# Patient Record
Sex: Female | Born: 1950 | Race: White | Hispanic: No | Smoking: Former smoker
Health system: Southern US, Community
[De-identification: ages and names within clinical notes are randomized; demographics above are authoritative.]

## PROBLEM LIST (undated history)

## (undated) DIAGNOSIS — E785 Hyperlipidemia, unspecified: Secondary | ICD-10-CM

## (undated) DIAGNOSIS — C541 Malignant neoplasm of endometrium: Secondary | ICD-10-CM

## (undated) DIAGNOSIS — K5792 Diverticulitis of intestine, part unspecified, without perforation or abscess without bleeding: Secondary | ICD-10-CM

## (undated) HISTORY — PX: OTHER SURGICAL HISTORY: SHX169

## (undated) HISTORY — PX: HAMMER TOE SURGERY: SHX385

## (undated) HISTORY — DX: Hyperlipidemia, unspecified: E78.5

## (undated) HISTORY — DX: Malignant neoplasm of endometrium: C54.1

---

## 1989-02-07 HISTORY — PX: EXPLORATORY LAPAROTOMY: SUR591

## 1998-07-08 ENCOUNTER — Other Ambulatory Visit: Admission: RE | Admit: 1998-07-08 | Discharge: 1998-07-08 | Payer: Self-pay | Admitting: Family Medicine

## 1998-08-18 ENCOUNTER — Encounter (INDEPENDENT_AMBULATORY_CARE_PROVIDER_SITE_OTHER): Payer: Self-pay | Admitting: Specialist

## 1998-08-18 ENCOUNTER — Other Ambulatory Visit: Admission: RE | Admit: 1998-08-18 | Discharge: 1998-08-18 | Payer: Self-pay | Admitting: Gastroenterology

## 1999-04-22 ENCOUNTER — Encounter: Payer: Self-pay | Admitting: Family Medicine

## 1999-04-22 ENCOUNTER — Encounter: Admission: RE | Admit: 1999-04-22 | Discharge: 1999-04-22 | Payer: Self-pay | Admitting: Family Medicine

## 1999-10-12 ENCOUNTER — Other Ambulatory Visit: Admission: RE | Admit: 1999-10-12 | Discharge: 1999-10-12 | Payer: Self-pay | Admitting: Internal Medicine

## 2000-05-17 ENCOUNTER — Encounter: Admission: RE | Admit: 2000-05-17 | Discharge: 2000-05-17 | Payer: Self-pay | Admitting: Internal Medicine

## 2000-05-17 ENCOUNTER — Encounter: Payer: Self-pay | Admitting: Internal Medicine

## 2000-07-31 ENCOUNTER — Ambulatory Visit (HOSPITAL_COMMUNITY)
Admission: RE | Admit: 2000-07-31 | Discharge: 2000-07-31 | Payer: Self-pay | Admitting: Orthodontics and Dentofacial Orthopedics

## 2000-07-31 ENCOUNTER — Encounter: Payer: Self-pay | Admitting: Orthodontics and Dentofacial Orthopedics

## 2000-08-06 ENCOUNTER — Ambulatory Visit (HOSPITAL_COMMUNITY)
Admission: RE | Admit: 2000-08-06 | Discharge: 2000-08-06 | Payer: Self-pay | Admitting: Orthodontics and Dentofacial Orthopedics

## 2000-10-18 ENCOUNTER — Encounter: Admission: RE | Admit: 2000-10-18 | Discharge: 2000-10-18 | Payer: Self-pay | Admitting: Internal Medicine

## 2000-10-18 ENCOUNTER — Encounter: Payer: Self-pay | Admitting: Internal Medicine

## 2001-07-23 ENCOUNTER — Encounter: Payer: Self-pay | Admitting: Internal Medicine

## 2001-07-23 ENCOUNTER — Encounter: Admission: RE | Admit: 2001-07-23 | Discharge: 2001-07-23 | Payer: Self-pay | Admitting: Internal Medicine

## 2002-05-21 ENCOUNTER — Encounter: Admission: RE | Admit: 2002-05-21 | Discharge: 2002-05-21 | Payer: Self-pay | Admitting: Internal Medicine

## 2002-05-21 ENCOUNTER — Encounter: Payer: Self-pay | Admitting: Internal Medicine

## 2002-06-10 ENCOUNTER — Ambulatory Visit (HOSPITAL_COMMUNITY): Admission: RE | Admit: 2002-06-10 | Discharge: 2002-06-10 | Payer: Self-pay | Admitting: Gastroenterology

## 2002-09-23 ENCOUNTER — Encounter: Admission: RE | Admit: 2002-09-23 | Discharge: 2002-09-23 | Payer: Self-pay | Admitting: Internal Medicine

## 2002-09-23 ENCOUNTER — Encounter: Payer: Self-pay | Admitting: Internal Medicine

## 2003-09-24 ENCOUNTER — Encounter: Admission: RE | Admit: 2003-09-24 | Discharge: 2003-09-24 | Payer: Self-pay | Admitting: Internal Medicine

## 2003-10-27 ENCOUNTER — Other Ambulatory Visit: Admission: RE | Admit: 2003-10-27 | Discharge: 2003-10-27 | Payer: Self-pay | Admitting: Internal Medicine

## 2004-10-15 ENCOUNTER — Encounter: Admission: RE | Admit: 2004-10-15 | Discharge: 2004-10-15 | Payer: Self-pay | Admitting: Family Medicine

## 2004-10-27 ENCOUNTER — Other Ambulatory Visit: Admission: RE | Admit: 2004-10-27 | Discharge: 2004-10-27 | Payer: Self-pay | Admitting: Obstetrics and Gynecology

## 2005-10-17 ENCOUNTER — Encounter: Admission: RE | Admit: 2005-10-17 | Discharge: 2005-10-17 | Payer: Self-pay | Admitting: Family Medicine

## 2005-10-31 ENCOUNTER — Other Ambulatory Visit: Admission: RE | Admit: 2005-10-31 | Discharge: 2005-10-31 | Payer: Self-pay | Admitting: Family Medicine

## 2006-10-20 ENCOUNTER — Encounter: Admission: RE | Admit: 2006-10-20 | Discharge: 2006-10-20 | Payer: Self-pay | Admitting: Family Medicine

## 2007-10-22 ENCOUNTER — Encounter: Admission: RE | Admit: 2007-10-22 | Discharge: 2007-10-22 | Payer: Self-pay | Admitting: Family Medicine

## 2008-03-21 ENCOUNTER — Ambulatory Visit: Admission: RE | Admit: 2008-03-21 | Discharge: 2008-03-21 | Payer: Self-pay | Admitting: Gynecology

## 2008-04-07 HISTORY — PX: ABDOMINAL HYSTERECTOMY: SHX81

## 2008-04-08 ENCOUNTER — Inpatient Hospital Stay (HOSPITAL_COMMUNITY): Admission: RE | Admit: 2008-04-08 | Discharge: 2008-04-11 | Payer: Self-pay | Admitting: Obstetrics and Gynecology

## 2008-04-08 ENCOUNTER — Encounter: Payer: Self-pay | Admitting: Gynecologic Oncology

## 2008-04-08 ENCOUNTER — Encounter (INDEPENDENT_AMBULATORY_CARE_PROVIDER_SITE_OTHER): Payer: Self-pay | Admitting: Obstetrics and Gynecology

## 2008-05-02 ENCOUNTER — Ambulatory Visit: Admission: RE | Admit: 2008-05-02 | Discharge: 2008-05-02 | Payer: Self-pay | Admitting: Gynecology

## 2008-06-04 ENCOUNTER — Ambulatory Visit: Admission: RE | Admit: 2008-06-04 | Discharge: 2008-06-04 | Payer: Self-pay | Admitting: Gynecologic Oncology

## 2008-10-08 ENCOUNTER — Ambulatory Visit: Admission: RE | Admit: 2008-10-08 | Discharge: 2008-10-08 | Payer: Self-pay | Admitting: Gynecologic Oncology

## 2008-10-08 ENCOUNTER — Encounter: Payer: Self-pay | Admitting: Gynecologic Oncology

## 2008-10-08 ENCOUNTER — Other Ambulatory Visit: Admission: RE | Admit: 2008-10-08 | Discharge: 2008-10-08 | Payer: Self-pay | Admitting: Gynecologic Oncology

## 2008-10-22 ENCOUNTER — Encounter: Admission: RE | Admit: 2008-10-22 | Discharge: 2008-10-22 | Payer: Self-pay | Admitting: Family Medicine

## 2009-09-04 ENCOUNTER — Ambulatory Visit: Admission: RE | Admit: 2009-09-04 | Discharge: 2009-09-04 | Payer: Self-pay | Admitting: Gynecology

## 2009-09-04 ENCOUNTER — Other Ambulatory Visit: Admission: RE | Admit: 2009-09-04 | Discharge: 2009-09-04 | Payer: Self-pay | Admitting: Gynecology

## 2009-10-23 ENCOUNTER — Encounter: Admission: RE | Admit: 2009-10-23 | Discharge: 2009-10-23 | Payer: Self-pay | Admitting: Family Medicine

## 2010-03-09 ENCOUNTER — Other Ambulatory Visit: Payer: Self-pay | Admitting: Obstetrics and Gynecology

## 2010-05-20 LAB — URINALYSIS, ROUTINE W REFLEX MICROSCOPIC
Glucose, UA: NEGATIVE mg/dL
Ketones, ur: NEGATIVE mg/dL
Ketones, ur: NEGATIVE mg/dL
Leukocytes, UA: NEGATIVE
Leukocytes, UA: NEGATIVE
Protein, ur: NEGATIVE mg/dL
Protein, ur: NEGATIVE mg/dL
pH: 6 (ref 5.0–8.0)
pH: 7 (ref 5.0–8.0)

## 2010-05-20 LAB — CBC
MCHC: 33.9 g/dL (ref 30.0–36.0)
MCV: 90.6 fL (ref 78.0–100.0)
RDW: 13 % (ref 11.5–15.5)
WBC: 8.6 10*3/uL (ref 4.0–10.5)

## 2010-05-20 LAB — BASIC METABOLIC PANEL
Calcium: 8.6 mg/dL (ref 8.4–10.5)
Chloride: 103 mEq/L (ref 96–112)
Creatinine, Ser: 0.67 mg/dL (ref 0.4–1.2)
Potassium: 3.7 mEq/L (ref 3.5–5.1)

## 2010-05-20 LAB — URINE CULTURE: Colony Count: 45000

## 2010-05-20 LAB — URINE MICROSCOPIC-ADD ON

## 2010-05-25 LAB — DIFFERENTIAL
Basophils Absolute: 0 10*3/uL (ref 0.0–0.1)
Basophils Relative: 1 % (ref 0–1)
Lymphs Abs: 1.2 10*3/uL (ref 0.7–4.0)
Monocytes Absolute: 0.5 10*3/uL (ref 0.1–1.0)
Neutrophils Relative %: 62 % (ref 43–77)

## 2010-05-25 LAB — CBC
HCT: 46 % (ref 36.0–46.0)
Hemoglobin: 15.5 g/dL — ABNORMAL HIGH (ref 12.0–15.0)
RBC: 5.07 MIL/uL (ref 3.87–5.11)
RDW: 12.6 % (ref 11.5–15.5)

## 2010-05-25 LAB — COMPREHENSIVE METABOLIC PANEL
ALT: 22 U/L (ref 0–35)
Alkaline Phosphatase: 75 U/L (ref 39–117)
BUN: 12 mg/dL (ref 6–23)
Calcium: 9.6 mg/dL (ref 8.4–10.5)
Chloride: 102 mEq/L (ref 96–112)
Creatinine, Ser: 0.8 mg/dL (ref 0.4–1.2)
GFR calc non Af Amer: >60 mL/min (ref >60)
Sodium: 136 mEq/L (ref 135–145)
Total Bilirubin: 0.9 mg/dL (ref 0.3–1.2)

## 2010-06-22 NOTE — Op Note (Signed)
NAME:  Debbie Pearson, Debbie Pearson             ACCOUNT NO.:  [id number]   MEDICAL RECORD NO.:  [id number]          PATIENT TYPE:  INP   LOCATION:  1534                         FACILITY:  [facility]   PHYSICIAN:  Paola A. Duard Brady, MD    DATE OF BIRTH:  Jan 07, 1951   DATE OF PROCEDURE:  04/08/2008  DATE OF DISCHARGE:                               OPERATIVE REPORT   PREOPERATIVE DIAGNOSIS:  Grade II endometrial carcinoma.   POSTOPERATIVE DIAGNOSIS:  Grade II endometrial carcinoma.   PROCEDURE:  Modified radical abdominal hysterectomy, BSO, bilateral  pelvic and periaortic lymph node dissection, scar revision.   SURGEONS:  Duard Brady and [employer].   ASSISTANT:  Aundria Rud.   ANESTHESIA:  General.   ANESTHESIOLOGIST:  Denenny.   ESTIMATED BLOOD LOSS:  75 mL.   FLUIDS:  2200 mL.   URINE OUTPUT:  125 mL.   COMPLICATIONS:  None.   SPECIMENS:  Pelvic washings, cervix, uterus, bilateral tubes and  ovaries, bilateral pelvic and periaortic lymph nodes sent to pathology.   OPERATIVE FINDINGS:  Include the omentum was adherent to the anterior  abdominal wall with filmy adhesions.  There was a 1-cm left obturator  node, otherwise no significant lymphadenopathy.  Minimal adhesive  disease from prior surgery.  There was a significant retracted abdominal  scar with keloid formation.   DESCRIPTION OF PROCEDURE:  The patient was taken to the operating room  and placed in supine position with all appropriate precautions.  General  anesthesia was then induced.  SCDs were placed.  She was then placed in  dorsal lithotomy position with appropriate precautions.  Her abdomen was  prepped in usual fashion.  The perineum was prepped in usual fashion.  Foley catheter was inserted in the bladder under sterile conditions.  The patient was then draped.  Time-out was performed to confirm the  patient, procedure, antibiotic status, allergies, DVT prophylaxis, and  surgeries.  In the area of her prior vertical infraumbilical  incision,  an elliptical incision was made around the keloid with the knife and the  scar was cut out with the knife.  We then extended the incision to the  underlying fascia using Bovie cautery.  The fascia was identified,  scored in the midline, extended.  This fascial incision was extended  superiorly and inferiorly.  The rectus bellies were dissected off the  overlying fascia.  The peritoneum was identified, tented and entered  sharply.  At this point, the omentum was noted to be adherent to the  anterior abdominal wall.  The omental adhesions were taken down using  cautery along the line of adhesive disease.  Once this was performed and  we were circumferential around the abdominal incision, a Bookwalter self-  retaining retractor was placed in the bed.  The retractor was used with  the smallest blades possible.  At this point, and several points during  the procedure, we ensured that there was no significant pressure on the  psoas bellies.  The patient was then placed in Trendelenburg position.  The bowel was packed out of the pelvis using moist laparotomy sponges.   Abdominopelvic washings were obtained.  Our attention was first drawn to  the left pelvic sidewall.  The round ligament on the left pelvic  sidewall was transected using monopolar cautery.  The anterior and  posterior leaves of the broad ligament were then opened.  The ureter was  identified.  A window was made between the IP and the ureter.  The IP  was clamped x2, transected, and suture ligated.  We then skeletonized  the uterine vessels on the patient's left side.  The bladder flap was  created.  At the cervical isthmic junction., we then came across with a  curved mattress and the uterine vessels were then transected and suture  ligated with zero Vicryl.  A similar procedure was performed on the  patient's right side.  We continued down the cardinal ligament with 1  more clamp, using the curved mattress.  Each  pedicle was created,  transected, and suture ligated.  We then came across the cervicovaginal  junction with the curved mattress and clamp.  The cervix was amputated  from the vagina.  The vaginal cuff was closed using 3 figure-of-eight  sutures of zero Vicryl.   At this point, our attention was drawn to the right pelvic sidewall.  The superior vesical artery was identified and the paravesical space was  opened.  A narrow ribbon was placed in the paravesical space.  The  pararectal space was opened.  The nodal bundle overlying the external  iliac artery was taken using monopolar cautery.  The general femoral  nerve was visualized on that side and spared.  We then continued up to  the level of the commons.  Coming down the internal iliac vessels, the  nodal bundle again was similarly removed using monopolar cautery.  The  ureter was deviated to the medial aspect to avoid injury.  A vein  retractor was then placed under the external iliac vein.  The obturator  nerve was identified.  The nodal bundle superior to the obturator nerve  was then dissected free from its lateral attachments, as well as the  superior vesical.  The nodal bundle was handed off as right pelvic lymph  node dissection.  A similar procedure was performed on the patient's  left side.  All pedicle sites were noted to be hemostatic.  Bilaterally  the general femoral nerves were spared as the obturator nerve was  identified.  The ureter was noted to be coursing medially.  We then  repacked the small bowel to allow access to the upper abdomen for the  periaortic lymph node dissection.  Once the bowel was packed out of the  way, an incision was made along the peritoneum overlying the aorta and  the common iliac artery on the patient's right side.  The ureter was  identified and deviated medial with a ribbon.  The nodal bundle  overlying the aorta to the common on the patient's right side and  overlying the internal inferior  vena cava was taken down using monopolar  cautery to the level of the ovarian vessels on her right side.  The  ureter was noted to be coursing well lateral of the area of dissection.  Throughout the dissection, there was no evidence of any bleeding.  Our  attention was then drawn to performing the left periaortic.  The IMA was  quite proximal.  In an effort not to extend the incision, were able to  identify the ureter on the patient's left side.  The nodal bundle to the  left of  the common on the patient's left side was taken down using  monopolar cautery.  The pedicles again were noted to be hemostatic.  The  abdomen and pelvis were copiously irrigated and all pedicles were  hemostatic.  The Bookwalter self-retaining retractor, all laparotomy and  Ray-Tec sponges removed from the patient's abdomen.  The omentum was  allowed to lie in the pelvis.  The fascia was closed using a running  mass closure of #1 PDS.  Subcu tissues were irrigated.  They were noted  to be hemostatic.  They were reapproximated with both zero and 2-0  Vicryl.  The skin was closed using a 4-0 Vicryl subcuticular suture at  the patient's request.  Steri-Strips and a bandage were applied.   The patient tolerated the procedure well, was taken to the recovery room  in stable condition.  All instrument, needle, Ray-Tec and laparotomy  counts were correct x2.      Paola A. Duard Brady, MD  Electronically Signed     PAG/MEDQ  D:  04/08/2008  T:  04/09/2008  Job:  [id number]   cc:   Telford Nab, R.N.  [address]  [place], [place] [address]   Miguel Aschoff, M.D.  Fax: [phone]   Bryan Lemma. Manus Gunning, M.D.  Fax: [phone]

## 2010-06-22 NOTE — Consult Note (Signed)
NAME:  Debbie Pearson, Debbie Pearson             ACCOUNT NO.:  [id number]   MEDICAL RECORD NO.:  [id number]          PATIENT TYPE:  OUT   LOCATION:  GYN                          FACILITY:  [facility]   PHYSICIAN:  Paola A. Duard Brady, MD    DATE OF BIRTH:  1950-06-26   DATE OF CONSULTATION:  10/08/2008  DATE OF DISCHARGE:                                 CONSULTATION   The patient is a very pleasant 60 year old who was initially seen by [place]  in February of 2010.  She underwent a modified radical abdominal  hysterectomy, BSO,  pelvic paraaortic lymph node dissection and scar  revision in March of 2010.  She had an uncomplicated postoperative  course.  Final pathology was consistent with stage IB, grade 2 lesion  with 80% involvement of the myometrium.  There was a smaller focus of  endometrial polyp with a high-grade endometrioid adenocarcinoma, grade  3.  The 24 lymph nodes were negative.  Washings were negative and there  is no lymphovascular space involvement.  She was seen by De Blanch, M.D. in March of 2010 and was disposition at that time to close  follow-up.  She comes in today for her first surveillance exam.  She is  very nervous today and worried that her Pap smear may be abnormal as it  was before her presentation of her endometrial cancer.  She states she  really feels quite good.  She has had some constipation for the past few  weeks, but admits to not taking her usual bowel or regimen of MiraLax.  When she has a particularly painful and uncomfortable stool she has  noticed a small amount of bright red blood per rectum with wiping.  When  her stools are more normal she does not notice that.  She denies any  vaginal bleeding.  She states that her diet has not been as high in  fiber as it normally is.  She has not been diligent as stated above with  her MiraLax and she has had more stress at work which always has been a  factor with her intestines.  She denies any vaginal bleeding,  any  abdominal or pelvic pain.   PHYSICAL EXAMINATION:  Weight 162 pounds, blood pressure 132/80.  Well-nourished, well-developed female in no acute distress.  NECK:  Supple.  There was no lymphadenopathy and no thyromegaly.  LUNGS:  Clear to auscultation bilaterally.  CARDIOVASCULAR EXAM:  Regular rate and rhythm.  ABDOMEN:  Shows a well-healed vertical midline incision with some small  amount of keloid formation.  ABDOMEN:  Soft, nontender, nondistended.  There are no palpable masses  or hepatosplenomegaly.  There are no incisional hernias.  GROINS:  Negative for adenopathy.  EXTREMITIES:  There was no edema.  PELVIC:  External genitalia was within normal limits.  There was no  evidence of any external hemorrhoids.  The vagina was slightly atrophic.  The vaginal cuff was visualized.  There were no visible lesions.  ThinPrep Pap was done without difficulty.  Bimanual examination revealed  no masses or nodularity.  RECTAL:  Confirms.   ASSESSMENT:  A  60 year old with stage IB, grade 2 endometrioid  adenocarcinoma who has no evidence of recurrent disease.   PLAN:  1. To follow up results for Pap smear from today.  We will notify her      of the results.  2. She was encouraged to get back on a regular bowel regimen.  If the      constipation continues even though she had a colonoscopy in May of      2009, she will need to discuss this with her primary care      physician.  3. She will follow up with Debbie Pearson, M.D. in 4 months and return to      see [place] in 8.      Paola A. Duard Brady, MD  Electronically Signed     PAG/MEDQ  D:  10/08/2008  T:  10/08/2008  Job:  [id number]   cc:   Debbie Pearson, M.D.   Telford Nab, R.N.  [address]  [place], [place] [address]

## 2010-06-22 NOTE — Consult Note (Signed)
NAME:  Debbie Pearson NO.:  [id number]   MEDICAL RECORD NO.:  [id number]          PATIENT TYPE:  OUT   LOCATION:  GYN                          FACILITY:  [facility]   PHYSICIAN:  De Blanch, M.D.DATE OF BIRTH:  01-Aug-1950   DATE OF CONSULTATION:  DATE OF DISCHARGE:                                 CONSULTATION   CHIEF COMPLAINT:  Postoperative follow-up and treatment planning.   HISTORY:  The patient returns today for discussion of her final  pathology.  She underwent a modified radical abdominal hysterectomy,  bilateral salpingo-oophorectomy, pelvic and periaortic lymphadenectomy  and scar revision by Dr. Cleda Mccreedy on April 08, 2008.  The patient has  had an uncomplicated postoperative course.  Final pathology showed a  moderately differentiated (grade 2) endometrial carcinoma invading 8 mm  of a 10-mm myometrium.  There is no evidence of involvement of the  cervix, adnexa or 22 pelvic and periaortic lymph nodes.  (Stage IB,  grade 2).   PHYSICAL EXAMINATION:  VITAL SIGNS:  Weight 157-1/2 pounds.  ABDOMEN:  Soft and nontender.  Midline incision is healing well.  PELVIC:  Exam is  deferred.   IMPRESSION/PLAN:  Stage IB, grade 2 endometrial cancer.  I reviewed the  pathology report with the patient and her sister.  Given these good  prognostic features, I would not recommend any additional therapy.  She  is given continued postoperative recovery instructions.  She will return  to see me in approximately 3 weeks for her final post-surgical checkup,  and then we will arrange alternate visits between our clinic and Dr.  Miguel Aschoff.      De Blanch, M.D.  Electronically Signed     DC/MEDQ  D:  05/02/2008  T:  05/02/2008  Job:  [id number]   cc:   Miguel Aschoff, M.D.  Fax: [phone]   Telford Nab, R.N.  [address]  [place], [place] [address]

## 2010-06-22 NOTE — Consult Note (Signed)
NAME:  Debbie Pearson, Debbie Pearson NO.:  [id number]   MEDICAL RECORD NO.:  [id number]          PATIENT TYPE:  OUT   LOCATION:  GYN                          FACILITY:  [facility]   PHYSICIAN:  De Blanch, M.D.DATE OF BIRTH:  1950/06/03   DATE OF CONSULTATION:  03/21/2008  DATE OF DISCHARGE:                                 CONSULTATION   REFERRING PHYSICIAN:  Dr. Miguel Aschoff.   A 60 year old white female seen in consultation at the request of Dr.  Tenny Craw regarding management of a newly diagnosed grade 2 endometrial  carcinoma.  The patient was initially seen for a routine GYN exam, at  that time she had an unsatisfactory Pap smear due to an inflammation  secondary to atrophic vaginitis.  She was treated with low-dose vaginal  estrogen cream starting in November.  She return for repeat Pap smear in  January which showed a high-grade squamous intraepithelial lesion and  atypical glandular cells.  The patient has not had any bleeding.  She  then underwent an endometrial biopsy, colposcopy and cervical biopsy.  Endometrial biopsy showed a grade 2 endometrial carcinoma, cervical  biopsy showed CIN 1.   The patient herself has minimal vaginal spotting at the present time and  minimal amount of lower abdominal discomfort, otherwise she seems to be  doing well.   PAST MEDICAL HISTORY:  Medical illnesses:  Hyperlipidemia.   CURRENT MEDICATIONS:  Citracal and simvastatin.   PAST SURGICAL HISTORY:  In 1991 the patient underwent an exploratory  laparotomy for a pelvic mass, was found to have endometriosis involving  the rectosigmoid colon which was resected and reanastomosed.   DRUG ALLERGIES:  SULFA (HIVES).   FAMILY HISTORY:  Sister with breast cancer.  The patient's sister has  been tested and is negative for BRCA mutations.  The patient's aunt had  ovarian cancer.   SOCIAL HISTORY:  The patient is divorced, she does not smoke.  She is an  Production designer, theatre/television/film.   REVIEW OF  SYSTEMS:  A 10-point comprehensive review of systems negative  except as noted above.  The patient has negative mammograms and negative  colonoscopy within the past year.   PHYSICAL EXAM:  Weight 159 pounds, height 5 foot 4, blood pressure  154/80, pulse 80.  GENERAL:  The patient is a healthy white female, no acute distress.  HEENT:  Negative.  NECK:  Supple without thyromegaly, there is no supraclavicular or  inguinal adenopathy.  ABDOMEN:  Soft, nontender.  No mass, organomegaly, ascites or hernias  noted.  She has a prominent midline scar which causes considerable  distortion of her umbilicus and is a retracted scar.  PELVIC:  EG, BUS, vagina, bladder, urethra are normal.  Cervix appears  normal except for some biopsy sites.  Uterus is anterior and normal  shape, size and consistency.  There is no adnexal masses noted.  Rectovaginal exam confirms.   IMPRESSION:  Grade 2 endometrial carcinoma.  The patient has previously  had extensive pelvic surgery including rectosigmoid resection with low  rectal anastomosis.  I would recommend the patient undergo exploratory  laparotomy, total abdominal hysterectomy, bilateral salpingo-  oophorectomy and pelvic and periaortic lymphadenectomy if she has deep  myometrial invasion based on intraoperative frozen section.  The patient  is aware the risks of surgery and she also has been informed that  because of her prior pelvic surgery she is at slightly higher risk for  injury to adjacent viscera.  She understands these risks.  All of her  questions are answered.  She wishes to proceed with surgery.  We will  coordinate this with Dr. Miguel Aschoff in the very near future.      De Blanch, M.D.  Electronically Signed     DC/MEDQ  D:  03/21/2008  T:  03/22/2008  Job:  [id number]   cc:   Miguel Aschoff, M.D.  Fax: [phone]   Telford Nab, R.N.  [address]  [place], [place] [address]

## 2010-06-22 NOTE — Consult Note (Signed)
NAME:  Debbie Pearson, Debbie Pearson NO.:  [id number]   MEDICAL RECORD NO.:  [id number]          PATIENT TYPE:  OUT   LOCATION:  GYN                          FACILITY[facility]  [facility]   PHYSICIAN:  Paola A. Duard Brady, MD    DATE OF BIRTH:  19-Sep-1950   DATE OF CONSULTATION:  06/04/2008  DATE OF DISCHARGE:                                 CONSULTATION   HISTORY:  The patient is a very pleasant 60 year old who was initially  seen by [place] in February 2010.  She subsequently underwent a modified  radical abdominal hysterectomy, BSO, pelvic and periaortic lymph node  dissection and scar revision by myself April 08, 2008.  She had an  uncomplicated postoperative course.  Final pathology was consistent with  a stage IB grade 2 lesion.  She had a grade 2 endometrial carcinoma  involving 80% of the myometrium.  There was a smaller focus of  endometrial polyp with invasive high-grade endometrioid carcinoma, grade  3.  All lymph nodes 0/24 were involved with bilateral 8 pelvic lymph  nodes for each the right and left pelvic lymph node region, 2 right  periaortic and 4 left periaortic lymph node.  Washings were negative and  there was no angiolymphatic involvement.  She saw Dr. Stanford Breed on  May 02, 2008 to discuss pathology and she was dispositioned to close  followup.  She comes in today for her postoperative check.  She states  that she was also happy with the results of her pathology and that she  did not require any additional therapy.  She did not think to ask Dr.  Stanford Breed any questions that she has for [place] today.  Since then, she  is overall feeling much stronger.  She had been on Prempro for a very  short time at the age of 70, but stopped it.  She states since her  hysterectomy, her hot flashes if anything have gotten better.  She is  wondering whether she should consider hormone replacement therapy.  She  has no night sweats.  She has regular return of bowel and bladder  function.   She is eating a regular diet.  She has gone back to work and  her employer has been very understanding, and she is feeling quite well.  She wants to know when she can start doing her body flow classes at the  gym.  She has been exercising primarily at this point by walking.   PHYSICAL EXAMINATION:  VITAL SIGNS:  Weight 158 pounds which is stable.  GENERAL:  Well-nourished, well-developed female in no acute distress.  ABDOMEN:  Shows well-healed vertical midline incision.  Abdomen is soft  and nontender.  PELVIC:  External genitalia is within normal limits.  The vagina is  somewhat atrophic.  The vaginal cuff is visualized.  It has healed well.  Bimanual examination reveals no masses or nodularity.   ASSESSMENT:  A 60 year old with stage IB grade 2 endometrioid  adenocarcinoma who clinically has no evidence of recurrent disease.  She  was dispositioned when she was seen by Dr. Stanford Breed in close  followup.   PLAN:  1. Return to see [place] in 3 months and then we will alternate visits with      Dr. Tenny Craw.  2. I believe she can start going back to the gym.  3. I do not believe that she needs to consider hormone replacement      therapy at this time.  4.  She is very pleased with her scar      revision and she was instructed to massage it twice daily for      improved healing.      Paola A. Duard Brady, MD  Electronically Signed     PAG/MEDQ  D:  06/04/2008  T:  06/04/2008  Job:  [id number]   cc:   Miguel Aschoff, M.D.  Fax: [phone]   Telford Nab, R.N.  [address]  [place], [place] [address]

## 2010-06-25 NOTE — Discharge Summary (Signed)
NAME:  Debbie Pearson, Debbie Pearson NO.:  [id number]   MEDICAL RECORD NO.:  [id number]         PATIENT TYPE:  LINP   LOCATION:                               FACILITY:  [facility]   PHYSICIAN:  Miguel Aschoff, M.D.       DATE OF BIRTH:  1950-06-11   DATE OF ADMISSION:  04/08/2008  DATE OF DISCHARGE:  04/11/2008                               DISCHARGE SUMMARY   ADMISSION DIAGNOSIS:  Endometrial carcinoma.   FINAL DIAGNOSIS:  Endometrial adenocarcinoma FIGO grade 2.   OPERATIONS AND PROCEDURES:  Total abdominal hysterectomy bilateral  salpingo-oophorectomy with periaortic and pelvic node dissections.  Anesthesia, general.   BRIEF HISTORY:  The patient is a 60 year old white female who presented  for routine gynecologic examination and was noted to have an  unsatisfactory Pap smear with inflammation felt secondary to atrophy.  The patient used low-dose estrogen cream and return for followup Pap  smear in January.  At this time she was noted to have high-grade  dysplasia with atypical glandular cells.  The patient underwent  colposcopic evaluation as well as endometrial biopsy and endometrial  biopsy revealed grade 2 endometrial adenocarcinoma with a cervical  biopsy showing CIN1.  Because of these findings, she was referred to the  [facility] for evaluation by Dr. De Blanch,  and based on his evaluation, the patient was admitted and scheduled for  the laparotomy with total abdominal hysterectomy and pelvic and aortic  lymph node biopsies.   HOSPITAL COURSE:  Preoperative studies were obtained.  This revealed  admission hemoglobin of 15.5, hematocrit 46, white count was 5000.  Chemistry profile was essentially within normal limits.  Urinalysis was  on admission negative.  The patient's blood type was noted to be A  positive.   HOSPITAL COURSE:  On April 08, 2008 under general anesthesia a modified  radical abdominal hysterectomy bilateral  salpingo-oophorectomy,  bilateral pelvic and aortic lymph node dissection was carried out  without difficulty.  The patient tolerated the surgical procedure well  and had an essentially uneventful postoperative course with increasing  ambulation and diet.  She did complain of some urinary symptoms and did  have urine culture sent in the postoperative period which revealed  45,000 colonies of Klebsiella sensitive to everything except ampicillin.  The patient's hemoglobin reached a nadir of 13.0.  She remained stable  and was able to be discharged home on April 11, 2008 in satisfactory  condition.   MEDICATIONS FOR HOME:  Included Tylox 1 every 3 hours as needed for  pain.   DISCHARGE INSTRUCTIONS:  She instructed do no heavy lifting, place  nothing in vagina, and to call if there were any other problems such as  fever, pain or heavy bleeding.  She was also instructed to continue her  medications that she was taking prior to her admission to hospital.  The  pathology report on the hysterectomy specimen revealed a grade 3  endometrioid adenocarcinoma with no lymphovascular invasion.  There was  0.8 cm of myometrial invasion in an area where the myometrium was noted  to have a total thickness  of 1 cm.  The maximum tumor size was 3.2 cm  and again, the histologic grade was endometrial adenocarcinoma, grade 3.  The lymph node biopsies were all negative, as was her cytology.   PLAN:  For the patient to be discharged home.  She is to return to the  [facility] to consult with Dr. Duard Brady regarding any  further therapy for her endometrioid adenocarcinoma of the endometrium.      Miguel Aschoff, M.D.  Electronically Signed     AR/MEDQ  D:  05/19/2008  T:  05/19/2008  Job:  [id number]   cc:   Rejeana Brock A. Duard Brady, MD  [address]  [place], [place] [address]

## 2010-06-25 NOTE — Op Note (Signed)
   NAME:  Debbie Pearson, Debbie Pearson                       ACCOUNT NO.:  [id number]   MEDICAL RECORD NO.:  [id number]                   PATIENT TYPE:  AMB   LOCATION:  ENDO                                 FACILITY:  [facility]   PHYSICIAN:  Danise Edge, M.D.                DATE OF BIRTH:  1950-08-17   DATE OF PROCEDURE:  06/10/2002  DATE OF DISCHARGE:                                 OPERATIVE REPORT   PROCEDURE:  Screening colonoscopy.   <REFERRING PHYSICIAN/>  Ike Bene, M.D.   INDICATIONS FOR PROCEDURE:  The patient is a 60 year old female born  [birth year]. Three years ago the patient underwent a colonoscopy  performed by Dr. Barnet Pall and colon polyps were removed. She is scheduled  today to undergo a surveillance colonoscopy with polypectomy to prevent  colon cancer.   ENDOSCOPIST:  Danise Edge, M.D.   PREMEDICATION:  1. Versed 10 mg.  2. Demerol 50 mg.   PROCEDURE:  After obtaining informed consent the patient was placed in the  left lateral decubitus position. I administered intravenous Demerol and  intravenous Versed to achieve conscious sedation for the procedure. The  patient's blood pressure, O2 saturation and cardiac rhythm were monitored  throughout the procedure and documented in the medical record.   Anal inspection was normal. Digital rectal exam was normal. The Olympus  pediatric video colonoscope was introduced into the rectum and easily  advanced to the cecum. A normal appearing ileocecal valve was intubated and  the distal ileum was inspected. Colonic preparation for the examination  today was excellent.   Rectum:  Normal.  Sigmoid colon and descending colon:  Normal.  Splenic flexure:  Normal.  Transverse colon:  Normal.  Hepatic flexure:  Normal.  Ascending colon:  Normal.  Cecum and ileocecal valve:  Normal.  Distal ileum:  Normal.    ASSESSMENT:  Normal screening proctocolonoscopy to the cecum with inspection  of the distal ileum.   RECOMMENDATIONS:  Repeat colonoscopy in 5 years (May 2009).                                               Danise Edge, M.D.    MJ/MEDQ  D:  06/10/2002  T:  06/10/2002  Job:  [id number]   cc:   Ike Bene, M.D.  [address]  [place]  [place] [address]  Fax: [phone]

## 2010-08-13 ENCOUNTER — Ambulatory Visit: Payer: Self-pay | Admitting: Gynecology

## 2010-09-20 ENCOUNTER — Other Ambulatory Visit: Payer: Self-pay | Admitting: Family Medicine

## 2010-09-20 DIAGNOSIS — Z1231 Encounter for screening mammogram for malignant neoplasm of breast: Secondary | ICD-10-CM

## 2010-09-24 ENCOUNTER — Other Ambulatory Visit: Payer: Self-pay | Admitting: Gynecology

## 2010-09-24 ENCOUNTER — Ambulatory Visit: Payer: BC Managed Care – PPO | Attending: Gynecology | Admitting: Gynecology

## 2010-09-24 ENCOUNTER — Other Ambulatory Visit (HOSPITAL_COMMUNITY)
Admission: RE | Admit: 2010-09-24 | Discharge: 2010-09-24 | Disposition: A | Discharge status: Home / Self Care | Payer: BC Managed Care – PPO | Source: Ambulatory Visit | Attending: Gynecology | Admitting: Gynecology

## 2010-09-24 DIAGNOSIS — Z79899 Other long term (current) drug therapy: Secondary | ICD-10-CM | POA: Insufficient documentation

## 2010-09-24 DIAGNOSIS — Z8540 Personal history of malignant neoplasm of unspecified female genital organ: Secondary | ICD-10-CM | POA: Insufficient documentation

## 2010-09-24 DIAGNOSIS — E785 Hyperlipidemia, unspecified: Secondary | ICD-10-CM | POA: Insufficient documentation

## 2010-09-24 DIAGNOSIS — C549 Malignant neoplasm of corpus uteri, unspecified: Secondary | ICD-10-CM | POA: Insufficient documentation

## 2010-09-24 DIAGNOSIS — Z90710 Acquired absence of both cervix and uterus: Secondary | ICD-10-CM | POA: Insufficient documentation

## 2010-09-24 DIAGNOSIS — Z9079 Acquired absence of other genital organ(s): Secondary | ICD-10-CM | POA: Insufficient documentation

## 2010-09-27 NOTE — Consult Note (Signed)
  NAME:  Debbie Pearson, Debbie Pearson NO.:  [id number]  MEDICAL RECORD NO.:  [id number]  LOCATION:  GYN                          FACILITY:  [facility]  PHYSICIAN:  De Blanch, M.D.DATE OF BIRTH:  [birth year]  DATE OF CONSULTATION:  09/24/2010 DATE OF DISCHARGE:                                CONSULTATION   CHIEF COMPLAINT:  Endometrial cancer.  The patient returns today for continuing followup of an endometrial cancer.  Since her last visit, she saw Dr. Tenny Craw about 6 months ago.  She denies any pelvic pain, pressure, vaginal bleeding, or discharge.  She has no GI or GU symptoms except for some chronic constipation.  She uses Colace intermittently and MiraLax intermittently.  She did have anal fissure last year, which we are treating with proctosol-HC 2.5%, which she says has given her great amount of relief.  HISTORY OF PRESENT ILLNESS:  Stage IB grade 2 endometrial cancer, undergoing modified radical abdominal hysterectomy, bilateral salpingo- oophorectomy, pelvic and periaortic lymphadenectomy and scar revision in March 2010.  She had deep myometrial invasion, but no other high risk factors and no adjuvant therapy was recommended.  PAST MEDICAL HISTORY:  Medical illnesses:  Hyperlipidemia.  CURRENT MEDICATIONS:  MiraLax, Colace, Citracal, and simvastatin.  PAST SURGICAL HISTORY:  Exploratory laparotomy for pelvic mass, 1991 (endometriosis), rectosigmoid resection with reanastomosis.  TAH-BSO, pelvic and periaortic lymphadenectomy.  DRUG ALLERGIES:  SULFA (hives).  FAMILY HISTORY:  Sister with breast cancer.  The patient has an aunt with ovarian cancer.  SOCIAL HISTORY:  The patient is divorced.  She does not smoke.  She is an Production designer, theatre/television/film.  REVIEW OF SYSTEMS:  10-point comprehensive review of systems negative except as noted above.  PHYSICAL EXAMINATION:  VITAL SIGNS:  Weight 164 pounds, blood pressure 120/60. GENERAL:  The patient is a healthy white  female in no acute distress. HEENT:  Negative. NECK:  Supple without thyromegaly or supraclavicular or inguinal adenopathy.  ABDOMEN:  Soft, nontender.  No mass, organomegaly, ascites or hernias noted. PELVIC:  BUS, vagina, bladder, urethra are normal.  Cervix and uterus surgically absent.  Adnexa without masses.  Rectovaginal exam confirms. LOWER EXTREMITIES:  Without edema or varicosities.  IMPRESSION:  Stage 1B, grade 2 endometrial carcinoma.  No evidence of disease.  Pap smears were pending.  The patient is given a new prescription for Proctosol-HC 2.5% to be applied b.i.d. p.r.n. for her anal fissure.  She is encouraged to use Colace on a regular basis and not go more than a day without having a bowel movement in hopes of avoiding large firm stools that are aggravating the fissure.  She will see Dr Miguel Aschoff in 6 months and return to the GYN Oncology Clinic in one year.     De Blanch, M.D.     DC/MEDQ  D:  09/24/2010  T:  09/24/2010  Job:  [id number]  cc:   Telford Nab, R.N. [address] [place], [place] [address]  Miguel Aschoff, MD  Angelia Mould. Derrell Lolling, M.D. [address], [address] [place] [address]  Electronically Signed by De Blanch M.D. on 09/27/2010 11:34:31 AM

## 2010-10-26 ENCOUNTER — Ambulatory Visit: Payer: Self-pay

## 2010-10-28 ENCOUNTER — Ambulatory Visit
Admission: RE | Admit: 2010-10-28 | Discharge: 2010-10-28 | Disposition: A | Discharge status: Home / Self Care | Payer: BC Managed Care – PPO | Source: Ambulatory Visit | Attending: Family Medicine | Admitting: Family Medicine

## 2010-10-28 DIAGNOSIS — Z1231 Encounter for screening mammogram for malignant neoplasm of breast: Secondary | ICD-10-CM

## 2010-11-05 ENCOUNTER — Other Ambulatory Visit: Payer: Self-pay | Admitting: Family Medicine

## 2010-11-05 DIAGNOSIS — R928 Other abnormal and inconclusive findings on diagnostic imaging of breast: Secondary | ICD-10-CM

## 2010-11-12 ENCOUNTER — Ambulatory Visit
Admission: RE | Admit: 2010-11-12 | Discharge: 2010-11-12 | Disposition: A | Discharge status: Home / Self Care | Payer: BC Managed Care – PPO | Source: Ambulatory Visit | Attending: Family Medicine | Admitting: Family Medicine

## 2010-11-12 DIAGNOSIS — R928 Other abnormal and inconclusive findings on diagnostic imaging of breast: Secondary | ICD-10-CM

## 2011-09-29 ENCOUNTER — Encounter: Payer: Self-pay | Admitting: Gynecologic Oncology

## 2011-09-30 ENCOUNTER — Encounter: Payer: Self-pay | Admitting: Gynecology

## 2011-09-30 ENCOUNTER — Other Ambulatory Visit (HOSPITAL_COMMUNITY)
Admission: RE | Admit: 2011-09-30 | Discharge: 2011-09-30 | Disposition: A | Discharge status: Home / Self Care | Payer: BC Managed Care – PPO | Source: Ambulatory Visit | Attending: Gynecology | Admitting: Gynecology

## 2011-09-30 ENCOUNTER — Ambulatory Visit: Payer: BC Managed Care – PPO | Attending: Gynecology | Admitting: Gynecology

## 2011-09-30 VITALS — BP 100/64 | HR 68 | Temp 99.1°F | Resp 16 | Ht 64.02 in | Wt 160.0 lb

## 2011-09-30 DIAGNOSIS — Z90710 Acquired absence of both cervix and uterus: Secondary | ICD-10-CM | POA: Insufficient documentation

## 2011-09-30 DIAGNOSIS — C541 Malignant neoplasm of endometrium: Secondary | ICD-10-CM | POA: Insufficient documentation

## 2011-09-30 DIAGNOSIS — Z01419 Encounter for gynecological examination (general) (routine) without abnormal findings: Secondary | ICD-10-CM | POA: Insufficient documentation

## 2011-09-30 DIAGNOSIS — C549 Malignant neoplasm of corpus uteri, unspecified: Secondary | ICD-10-CM | POA: Insufficient documentation

## 2011-09-30 DIAGNOSIS — Z79899 Other long term (current) drug therapy: Secondary | ICD-10-CM | POA: Insufficient documentation

## 2011-09-30 DIAGNOSIS — E785 Hyperlipidemia, unspecified: Secondary | ICD-10-CM | POA: Insufficient documentation

## 2011-09-30 NOTE — Patient Instructions (Signed)
We will contact you at your Pap smear report.  Please schedule an appointment with Dr. Duane Lope in 6 months and return to see [place] in one year.

## 2011-09-30 NOTE — Progress Notes (Signed)
Consult Note: Gyn-Onc   Debbie Pearson 61 y.o. female  Chief Complaint  Patient presents with  . Endo ca    Follow up    Interval History: Since her last visit the patient's done well. She saw Dr. Duane Lope proximally 6 months ago. She is having annual mammograms. She specifically denies any GI or GU symptoms. She has no pelvic pain pressure vaginal bleeding or discharge. Her functional status is excellent.  HPI: Stage IB grade 2 endometrial carcinoma undergoing initial TAH/BSO pelvic and periaortic lymphadenectomy in March of 2010. She had deep myometrial invasion but no other high-risk features and has been followed with no further therapy.  Review of Systems:10 point review of systems is negative as noted above.   Vitals: Blood pressure 100/64, pulse 68, temperature 99.1 F (37.3 C), temperature source Oral, resp. rate 16, height 5' 4.02" (1.626 m), weight 160 lb (72.576 kg).  Physical Exam: General : The patient is a healthy woman in no acute distress.  HEENT: normocephalic, extraoccular movements normal; neck is supple without thyromegally  Lynphnodes: Supraclavicular and inguinal nodes not enlarged  Abdomen: Soft, non-tender, no ascites, no organomegally, no masses, no hernias  Pelvic:  EGBUS: Normal female  Vagina: Normal, no lesions  Urethra and Bladder: Normal, non-tender  Cervix: Surgically absent  Uterus: Surgically absent  Bi-manual examination: Non-tender; no adenxal masses or nodularity  Rectal: normal sphincter tone, no masses, no blood  Lower extremities: No edema or varicosities. Normal range of motion    Assessment/Plan: Stage IB grade 2 endometrial cancer March 2010. The patient's clinically free of disease. Pap smears obtained. The patient return to see Dr. Tenny Craw in 6 months and returned to see [place] in one year. Allergies  Allergen Reactions  . Sulfa Antibiotics Hives    Past Medical History  Diagnosis Date  . Hyperlipidemia   . Endometrial cancer     Stage IB grade 2    Past Surgical History  Procedure Date  . Abdominal hysterectomy 04/2008    modified rad hyst, BSO, pelvic and periaortic lymphadenectomy and scar revision  . Exploratory laparotomy 1991    endometriosis  . Rectosigmoid resection with reanastomosis     Current Outpatient Prescriptions  Medication Sig Dispense Refill  . Calcium Citrate (CITRACAL PO) Take by mouth.      Tery Sanfilippo Sodium (COLACE PO) Take by mouth.      . Polyethylene Glycol 3350 (MIRALAX PO) Take by mouth.      Marland Kitchen SIMVASTATIN PO Take by mouth.        History   Social History  . Marital Status: Divorced    Spouse Name: N/A    Number of Children: N/A  . Years of Education: N/A   Occupational History  . Not on file.   Social History Main Topics  . Smoking status: Never Smoker   . Smokeless tobacco: Not on file  . Alcohol Use: Yes     occassional  . Drug Use: No  . Sexually Active: No   Other Topics Concern  . Not on file   Social History Narrative  . No narrative on file    Family History  Problem Relation Age of Onset  . Breast cancer Sister   . Ovarian cancer Maternal Aunt       Jeannette Corpus, MD 09/30/2011, 9:56 AM

## 2011-10-05 ENCOUNTER — Other Ambulatory Visit: Payer: Self-pay | Admitting: Obstetrics and Gynecology

## 2011-10-05 DIAGNOSIS — Z1231 Encounter for screening mammogram for malignant neoplasm of breast: Secondary | ICD-10-CM

## 2011-10-06 ENCOUNTER — Telehealth: Payer: Self-pay | Admitting: Gynecologic Oncology

## 2011-10-06 NOTE — Telephone Encounter (Signed)
Message left for patient with pap smear results: negative.  Instructed to call for any questions or concerns.  

## 2011-11-01 ENCOUNTER — Ambulatory Visit
Admission: RE | Admit: 2011-11-01 | Discharge: 2011-11-01 | Disposition: A | Discharge status: Home / Self Care | Payer: BC Managed Care – PPO | Source: Ambulatory Visit | Attending: Obstetrics and Gynecology | Admitting: Obstetrics and Gynecology

## 2011-11-01 DIAGNOSIS — Z1231 Encounter for screening mammogram for malignant neoplasm of breast: Secondary | ICD-10-CM

## 2012-03-28 ENCOUNTER — Other Ambulatory Visit: Payer: Self-pay | Admitting: Obstetrics and Gynecology

## 2012-04-09 ENCOUNTER — Ambulatory Visit
Admission: RE | Admit: 2012-04-09 | Discharge: 2012-04-09 | Disposition: A | Discharge status: Home / Self Care | Payer: BC Managed Care – PPO | Source: Ambulatory Visit | Attending: Obstetrics and Gynecology | Admitting: Obstetrics and Gynecology

## 2012-04-09 DIAGNOSIS — N6489 Other specified disorders of breast: Secondary | ICD-10-CM

## 2012-06-11 ENCOUNTER — Encounter: Payer: Self-pay | Admitting: Gynecology

## 2012-06-11 NOTE — Progress Notes (Signed)
Pt is getting ready to switch ins companies.  She questioned whether we are in network with [facility] or [place].  I assured her that we are in network with both companies.

## 2012-09-11 ENCOUNTER — Encounter: Payer: Self-pay | Admitting: Gynecology

## 2012-09-11 ENCOUNTER — Ambulatory Visit: Payer: BC Managed Care – PPO | Attending: Gynecology | Admitting: Gynecology

## 2012-09-11 VITALS — BP 122/82 | HR 62 | Temp 97.9°F | Resp 16 | Ht 64.0 in | Wt 160.6 lb

## 2012-09-11 DIAGNOSIS — Z8542 Personal history of malignant neoplasm of other parts of uterus: Secondary | ICD-10-CM | POA: Insufficient documentation

## 2012-09-11 DIAGNOSIS — Z9079 Acquired absence of other genital organ(s): Secondary | ICD-10-CM | POA: Insufficient documentation

## 2012-09-11 DIAGNOSIS — Z09 Encounter for follow-up examination after completed treatment for conditions other than malignant neoplasm: Secondary | ICD-10-CM | POA: Insufficient documentation

## 2012-09-11 DIAGNOSIS — C541 Malignant neoplasm of endometrium: Secondary | ICD-10-CM

## 2012-09-11 DIAGNOSIS — E785 Hyperlipidemia, unspecified: Secondary | ICD-10-CM | POA: Insufficient documentation

## 2012-09-11 DIAGNOSIS — Z90710 Acquired absence of both cervix and uterus: Secondary | ICD-10-CM | POA: Insufficient documentation

## 2012-09-11 DIAGNOSIS — K602 Anal fissure, unspecified: Secondary | ICD-10-CM

## 2012-09-11 MED ORDER — HYDROCORTISONE 2.5 % RE CREA: 1.0000 "application " | TOPICAL_CREAM | RECTAL | Status: AC | PRN

## 2012-09-11 NOTE — Patient Instructions (Signed)
Return to see Dr. Ross in 6 months return to see us in one year. 

## 2012-09-11 NOTE — Progress Notes (Signed)
Consult Note: Gyn-Onc   Debbie Pearson 62 y.o. female  Chief Complaint  Patient presents with  . Endometrial Cancer    Follow up   assessment: Stage IB grade 2 endometrial carcinoma 2010. Patient's clinically free of disease.  Plan: The patient return to see Dr. Duane Lope in 6 months return to see [place] in one year. She will go ahead with annual mammograms and is scheduled at colonoscopy next week.  Interval History: Since her last visit the patient's done well. She saw Dr. Duane Lope proximally 6 months ago. She is having annual mammograms. She specifically denies any GI or GU symptoms. She has no pelvic pain pressure vaginal bleeding or discharge. Her functional status is excellent.  HPI: Stage IB grade 2 endometrial carcinoma undergoing initial TAH/BSO pelvic and periaortic lymphadenectomy in March of 2010. She had deep myometrial invasion but no other high-risk features and has been followed with no further therapy.  Review of Systems:10 point review of systems is negative as noted above.   Vitals: Blood pressure 122/82, pulse 62, temperature 97.9 F (36.6 C), resp. rate 16, height 5\' 4"  (1.626 m), weight 160 lb 9.6 oz (72.848 kg).  Physical Exam: General : The patient is a healthy woman in no acute distress.  HEENT: normocephalic, extraoccular movements normal; neck is supple without thyromegally  Lynphnodes: Supraclavicular and inguinal nodes not enlarged  Abdomen: Soft, non-tender, no ascites, no organomegally, no masses, no hernias  Pelvic:  EGBUS: Normal female  Vagina: Normal, no lesions  Urethra and Bladder: Normal, non-tender  Cervix: Surgically absent  Uterus: Surgically absent  Bi-manual examination: Non-tender; no adenxal masses or nodularity  Rectal: normal sphincter tone, no masses, no blood  Lower extremities: No edema or varicosities. Normal range of motion    Assessment/Plan: Stage IB grade 2 endometrial cancer March 2010. The patient's clinically free of  disease. Pap smears obtained. The patient return to see Dr. Tenny Craw in 6 months and returned to see [place] in one year. Allergies  Allergen Reactions  . Sulfa Antibiotics Hives    Past Medical History  Diagnosis Date  . Hyperlipidemia   . Endometrial cancer     Stage IB grade 2    Past Surgical History  Procedure Laterality Date  . Abdominal hysterectomy  04/2008    modified rad hyst, BSO, pelvic and periaortic lymphadenectomy and scar revision  . Exploratory laparotomy  1991    endometriosis  . Rectosigmoid resection with reanastomosis      Current Outpatient Prescriptions  Medication Sig Dispense Refill  . Calcium Carbonate-Vitamin D (OSCAL 500/200 D-3 PO) Take 1 tablet by mouth daily.      Tery Sanfilippo Sodium (COLACE PO) Take by mouth as needed.       . hydrocortisone (PROCTOSOL HC) 2.5 % rectal cream Place 1 application rectally as needed for hemorrhoids.  30 g  4  . Polyethylene Glycol 3350 (MIRALAX PO) Take by mouth as needed.       Marland Kitchen SIMVASTATIN PO Take 20 mg by mouth daily after supper.       . Vitamin D, Ergocalciferol, (DRISDOL) 50000 UNITS CAPS capsule Take 50,000 Units by mouth every 7 (seven) days.       No current facility-administered medications for this visit.    History   Social History  . Marital Status: Divorced    Spouse Name: N/A    Number of Children: N/A  . Years of Education: N/A   Occupational History  . Not on file.  Social History Main Topics  . Smoking status: Never Smoker   . Smokeless tobacco: Not on file  . Alcohol Use: Yes     Comment: occassional  . Drug Use: No  . Sexually Active: No   Other Topics Concern  . Not on file   Social History Narrative  . No narrative on file    Family History  Problem Relation Age of Onset  . Breast cancer Sister   . Ovarian cancer Maternal Aunt       Jeannette Corpus, MD 09/11/2012, 8:05 AM

## 2012-09-20 ENCOUNTER — Other Ambulatory Visit: Payer: Self-pay | Admitting: Gastroenterology

## 2012-10-04 ENCOUNTER — Other Ambulatory Visit: Payer: Self-pay

## 2012-10-04 DIAGNOSIS — Z1231 Encounter for screening mammogram for malignant neoplasm of breast: Secondary | ICD-10-CM

## 2012-11-05 ENCOUNTER — Ambulatory Visit
Admission: RE | Admit: 2012-11-05 | Discharge: 2012-11-05 | Disposition: A | Discharge status: Home / Self Care | Payer: BC Managed Care – PPO | Source: Ambulatory Visit

## 2012-11-05 DIAGNOSIS — Z1231 Encounter for screening mammogram for malignant neoplasm of breast: Secondary | ICD-10-CM

## 2013-04-19 ENCOUNTER — Other Ambulatory Visit: Payer: Self-pay | Admitting: Obstetrics and Gynecology

## 2013-08-16 ENCOUNTER — Encounter: Payer: Self-pay | Admitting: Vascular Surgery

## 2013-08-16 ENCOUNTER — Other Ambulatory Visit: Payer: Self-pay

## 2013-08-16 DIAGNOSIS — I83893 Varicose veins of bilateral lower extremities with other complications: Secondary | ICD-10-CM

## 2013-08-16 DIAGNOSIS — M79605 Pain in left leg: Secondary | ICD-10-CM

## 2013-09-17 ENCOUNTER — Encounter: Payer: Self-pay | Admitting: Vascular Surgery

## 2013-09-18 ENCOUNTER — Ambulatory Visit (INDEPENDENT_AMBULATORY_CARE_PROVIDER_SITE_OTHER): Payer: BC Managed Care – PPO | Admitting: Vascular Surgery

## 2013-09-18 ENCOUNTER — Ambulatory Visit (HOSPITAL_COMMUNITY)
Admission: RE | Admit: 2013-09-18 | Discharge: 2013-09-18 | Disposition: A | Discharge status: Home / Self Care | Payer: BC Managed Care – PPO | Source: Ambulatory Visit | Attending: Vascular Surgery | Admitting: Vascular Surgery

## 2013-09-18 ENCOUNTER — Encounter: Payer: Self-pay | Admitting: Vascular Surgery

## 2013-09-18 VITALS — BP 148/84 | HR 71 | Ht 64.0 in | Wt 161.5 lb

## 2013-09-18 DIAGNOSIS — E785 Hyperlipidemia, unspecified: Secondary | ICD-10-CM | POA: Insufficient documentation

## 2013-09-18 DIAGNOSIS — Z87891 Personal history of nicotine dependence: Secondary | ICD-10-CM | POA: Diagnosis not present

## 2013-09-18 DIAGNOSIS — I789 Disease of capillaries, unspecified: Secondary | ICD-10-CM | POA: Diagnosis not present

## 2013-09-18 DIAGNOSIS — M79605 Pain in left leg: Secondary | ICD-10-CM

## 2013-09-18 DIAGNOSIS — I83893 Varicose veins of bilateral lower extremities with other complications: Secondary | ICD-10-CM | POA: Insufficient documentation

## 2013-09-18 DIAGNOSIS — M79609 Pain in unspecified limb: Secondary | ICD-10-CM | POA: Diagnosis not present

## 2013-09-18 NOTE — Assessment & Plan Note (Signed)
This patient does have evidence of chronic venous insufficiency on the left with reflux in the deep veins but no reflux in the greater saphenous vein. She has some telangiectasias bilaterally and spider veins. I have explained that she would be at risk for recurrence given her deep vein reflux on the left. If she underwent sclerotherapy. We have discussed the importance of intermittent leg elevation and I have instructed her on the proper positioning for this. We have also discussed the use of compression stockings. I have written her a prescription for a knee-high stockings with a gradient of 15-20 mm of mercury. I have encouraged her to stay as active as possible. I have encouraged her to avoid prolonged sitting and standing. I have asked her to consider water aerobics also. I will see her back as needed if any new vascular issues arise.

## 2013-09-18 NOTE — Progress Notes (Signed)
Patient ID: Debbie Pearson, female   DOB: [birth year], 63 y.o.   MRN: [id number]  Reason for Consult: Varicose Veins   Referred by Gaynelle Arabian, MD  Subjective:     HPI:  Debbie Pearson is a 63 y.o. female who has had a long history of some varicose veins of both lower extremities. She had noticed some aching in her left leg recently and wished to have her veins evaluated. She does experience an aching pain in the left leg associated with standing and relieved somewhat with elevation. She's had no specific symptoms on the right side. She denies any previous history of DVT or phlebitis. 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  I have reviewed her records from Dr. Andrew Au office. He notes her pain is likely related to her varicosities and sent her for vascular evaluation.  Past Medical History  Diagnosis Date  . Hyperlipidemia   . Endometrial cancer     Stage IB grade 2   Family History  Problem Relation Age of Onset  . Breast cancer Sister   . Cancer Sister   . Ovarian cancer Maternal Aunt   . Diabetes Mother   . Hypertension Mother   . Varicose Veins Mother   . Heart disease Father     before age 84  . Hyperlipidemia Father   . Hyperlipidemia Brother   . Hypertension Brother    Past Surgical History  Procedure Laterality Date  . Abdominal hysterectomy  04/2008    modified rad hyst, BSO, pelvic and periaortic lymphadenectomy and scar revision  . Exploratory laparotomy  1991    endometriosis  . Rectosigmoid resection with reanastomosis     Short Social History:  History  Substance Use Topics  . Smoking status: Former Smoker -- 2 years  . Smokeless tobacco: Former Systems developer    Quit date: 02/08/1968  . Alcohol Use: Yes     Comment: occassional   Allergies  Allergen Reactions  . Sulfa Antibiotics Hives   Current Outpatient Prescriptions  Medication Sig Dispense Refill  . Calcium Carbonate-Vitamin D (OSCAL 500/200 D-3 PO)  Take 1 tablet by mouth daily.      Marland Kitchen SIMVASTATIN PO Take 20 mg by mouth daily after supper.       . Vitamin D, Ergocalciferol, (DRISDOL) 50000 UNITS CAPS capsule Take 50,000 Units by mouth every 7 (seven) days.      Mariane Baumgarten Sodium (COLACE PO) Take by mouth as needed.       . hydrocortisone (PROCTOSOL HC) 2.5 % rectal cream Place 1 application rectally as needed for hemorrhoids.  30 g  4  . Polyethylene Glycol 3350 (MIRALAX PO) Take by mouth as needed.        No current facility-administered medications for this visit.   Review of Systems  Constitutional: Negative for chills and fever.  Eyes: Negative for loss of vision.  Respiratory: Negative for cough and wheezing.  Cardiovascular: Negative for chest pain, chest tightness, claudication, dyspnea with exertion, orthopnea and palpitations.  GI: Negative for blood in stool and vomiting.  GU: Negative for dysuria and hematuria.  Musculoskeletal: Negative for leg pain, joint pain and myalgias.  Skin: Negative for rash and wound.  Neurological: Negative for dizziness and speech difficulty.  Hematologic: Negative for bruises/bleeds easily. Psychiatric: Negative for depressed mood.       Objective:  Objective  Filed Vitals:   09/18/13 0912  BP: 148/84  Pulse: 71  Height:  5\' 4"  (1.626 m)  Weight: 161 lb 8 oz (73.256 kg)  SpO2: 100%   Body mass index is 27.71 kg/(m^2).  Physical Exam  Constitutional: She is oriented to person, place, and time. She appears well-developed and well-nourished.  HENT:  Head: Normocephalic and atraumatic.  Neck: Neck supple. No JVD present. No thyromegaly present.  Cardiovascular: Normal rate, regular rhythm and normal heart sounds.  Exam reveals no friction rub.   No murmur heard. Pulmonary/Chest: Breath sounds normal. She has no wheezes. She has no rales.  Abdominal: Soft. Bowel sounds are normal. There is no tenderness.  Musculoskeletal: Normal range of motion. She exhibits no edema.    Lymphadenopathy:    She has no cervical adenopathy.  Neurological: She is alert and oriented to person, place, and time. She has normal strength. No sensory deficit.  Skin: No lesion and no rash noted.  She has some telangiectasias along her lateral thigh on the left. She also has some telangiectasias in her anterior left leg. She has some small spider veins in both legs.  Psychiatric: She has a normal mood and affect.   Data: VENOUS DUPLEX: I have independently interpreted her venous duplex scan which does show some deep vein reflux on the left. There is no DVT. There is no evidence of phlebitis. There is no reflux in the greater saphenous vein.      Assessment/Plan:    Varicose veins of lower extremities with other complications This patient does have evidence of chronic venous insufficiency on the left with reflux in the deep veins but no reflux in the greater saphenous vein. She has some telangiectasias bilaterally and spider veins. I have explained that she would be at risk for recurrence given her deep vein reflux on the left. If she underwent sclerotherapy. We have discussed the importance of intermittent leg elevation and I have instructed her on the proper positioning for this. We have also discussed the use of compression stockings. I have written her a prescription for a knee-high stockings with a gradient of 15-20 mm of mercury. I have encouraged her to stay as active as possible. I have encouraged her to avoid prolonged sitting and standing. I have asked her to consider water aerobics also. I will see her back as needed if any new vascular issues arise.   Angelia Mould MD [facility]

## 2013-09-20 ENCOUNTER — Encounter: Payer: Self-pay | Admitting: Gynecology

## 2013-09-20 ENCOUNTER — Ambulatory Visit: Payer: BC Managed Care – PPO | Attending: Gynecology | Admitting: Gynecology

## 2013-09-20 ENCOUNTER — Other Ambulatory Visit (HOSPITAL_COMMUNITY)
Admission: RE | Admit: 2013-09-20 | Discharge: 2013-09-20 | Disposition: A | Discharge status: Home / Self Care | Payer: BC Managed Care – PPO | Source: Ambulatory Visit | Attending: Family | Admitting: Family

## 2013-09-20 VITALS — BP 128/65 | HR 69 | Temp 98.6°F | Resp 20 | Ht 64.0 in | Wt 159.2 lb

## 2013-09-20 DIAGNOSIS — C549 Malignant neoplasm of corpus uteri, unspecified: Secondary | ICD-10-CM

## 2013-09-20 DIAGNOSIS — Z8542 Personal history of malignant neoplasm of other parts of uterus: Secondary | ICD-10-CM | POA: Diagnosis present

## 2013-09-20 DIAGNOSIS — Z87891 Personal history of nicotine dependence: Secondary | ICD-10-CM | POA: Insufficient documentation

## 2013-09-20 DIAGNOSIS — Z01419 Encounter for gynecological examination (general) (routine) without abnormal findings: Secondary | ICD-10-CM | POA: Insufficient documentation

## 2013-09-20 DIAGNOSIS — Z882 Allergy status to sulfonamides status: Secondary | ICD-10-CM | POA: Diagnosis not present

## 2013-09-20 DIAGNOSIS — Z79899 Other long term (current) drug therapy: Secondary | ICD-10-CM | POA: Insufficient documentation

## 2013-09-20 DIAGNOSIS — C541 Malignant neoplasm of endometrium: Secondary | ICD-10-CM

## 2013-09-20 NOTE — Addendum Note (Signed)
Addended by: Joylene John D on: 09/20/2013 01:56 PM   Modules accepted: Orders

## 2013-09-20 NOTE — Patient Instructions (Addendum)
Plan to follow up with Dr. Harrington Challenger.  Please call for any questions or concerns.  Good luck to you! Use Vagifem twice a week at bedtime.

## 2013-09-20 NOTE — Progress Notes (Signed)
Consult Note: Gyn-Onc   Debbie Pearson 63 y.o. female  Chief Complaint  Patient presents with  . Endo ca    Follow up   Assessment: Stage IB grade 2 endometrial carcinoma 2010. Patient's clinically free of disease. Atrophic vaginitis.  Plan: The patient return to see Dr. Melinda Crutch for annual exam. At this juncture this 5 years of followup, we will discharge her from our practice. Be happy to see her for any future problems arise.  She is given prescription for Vagifem to be used twice a week. Pap smears obtained today.  Interval History: Since her last visit the patient's done well. She saw Dr. Melinda Crutch proximally 6 months ago. She is having annual mammograms. She specifically denies any GI or GU symptoms. She has no pelvic pain pressure vaginal bleeding or discharge. Her functional status is excellent. The patient does note intermittent vaginal discharge. No bleeding or pelvic pain is reported.  HPI: Stage IB grade 2 endometrial carcinoma undergoing initial TAH/BSO pelvic and periaortic lymphadenectomy in March of 2010. She had deep myometrial invasion but no other high-risk features and has been followed with no further therapy.  Review of Systems:10 point review of systems is negative as noted above.   Vitals: Blood pressure 128/65, pulse 69, temperature 98.6 F (37 C), temperature source Oral, resp. rate 20, height 5\' 4"  (1.626 m), weight 159 lb 3.2 oz (72.213 kg).  Physical Exam: General : The patient is a healthy woman in no acute distress.  HEENT: normocephalic, extraoccular movements normal; neck is supple without thyromegally  Lynphnodes: Supraclavicular and inguinal nodes not enlarged  Abdomen: Soft, non-tender, no ascites, no organomegally, no masses, no hernias  Pelvic:  EGBUS: Normal female  Vagina: Atrophic, no lesions are noted. Urethra and Bladder: Normal, non-tender  Cervix: Surgically absent  Uterus: Surgically absent  Bi-manual examination: Non-tender; no  adenxal masses or nodularity  Rectal: normal sphincter tone, no masses, no blood  Lower extremities: No edema or varicosities. Normal range of motion    Assessment/Plan: Stage IB grade 2 endometrial cancer March 2010. The patient's clinically free of disease. Pap smears obtained. The patient return to see Dr. Harrington Challenger in 6 months and returned to see [place] in one year. Allergies  Allergen Reactions  . Sulfa Antibiotics Hives    Past Medical History  Diagnosis Date  . Hyperlipidemia   . Endometrial cancer     Stage IB grade 2    Past Surgical History  Procedure Laterality Date  . Abdominal hysterectomy  04/2008    modified rad hyst, BSO, pelvic and periaortic lymphadenectomy and scar revision  . Exploratory laparotomy  1991    endometriosis  . Rectosigmoid resection with reanastomosis      Current Outpatient Prescriptions  Medication Sig Dispense Refill  . Calcium Carbonate-Vitamin D (OSCAL 500/200 D-3 PO) Take 1 tablet by mouth daily.      Marland Kitchen SIMVASTATIN PO Take 20 mg by mouth daily after supper.       . Vitamin D, Ergocalciferol, (DRISDOL) 50000 UNITS CAPS capsule Take 50,000 Units by mouth every 7 (seven) days.      Mariane Baumgarten Sodium (COLACE PO) Take by mouth as needed.       . hydrocortisone (PROCTOSOL HC) 2.5 % rectal cream Place 1 application rectally as needed for hemorrhoids.  30 g  4  . Polyethylene Glycol 3350 (MIRALAX PO) Take by mouth as needed.       . Prednicarbate 0.1 % CREA  No current facility-administered medications for this visit.    History   Social History  . Marital Status: Divorced    Spouse Name: N/A    Number of Children: N/A  . Years of Education: N/A   Occupational History  . Not on file.   Social History Main Topics  . Smoking status: Former Smoker -- 2 years  . Smokeless tobacco: Former Systems developer    Quit date: 02/08/1968  . Alcohol Use: Yes     Comment: occassional  . Drug Use: No  . Sexual Activity: No   Other Topics Concern  . Not on  file   Social History Narrative  . No narrative on file    Family History  Problem Relation Age of Onset  . Breast cancer Sister   . Cancer Sister   . Ovarian cancer Maternal Aunt   . Diabetes Mother   . Hypertension Mother   . Varicose Veins Mother   . Heart disease Father     before age 51  . Hyperlipidemia Father   . Hyperlipidemia Brother   . Hypertension Brother       Alvino Chapel, MD 09/20/2013, 10:47 AM

## 2013-09-25 ENCOUNTER — Telehealth: Payer: Self-pay | Admitting: *Deleted

## 2013-09-25 LAB — CYTOLOGY - PAP

## 2013-09-25 NOTE — Telephone Encounter (Signed)
Message copied by Lucile Crater on Wed Sep 25, 2013  3:32 PM ------      Message from: CROSS, MELISSA D      Created: Wed Sep 25, 2013  3:20 PM       Please let her know her pap smear was normal.  Thank you            ----- Message -----         From: [facility] Interface         Sent: 09/25/2013   2:39 PM           To: Debbie Gibbs, NP                   ------

## 2013-09-25 NOTE — Telephone Encounter (Signed)
Unable to reach pt,lmovm,notified pt of results. Requested pt call office to confirm message was received.

## 2013-10-01 ENCOUNTER — Encounter (INDEPENDENT_AMBULATORY_CARE_PROVIDER_SITE_OTHER): Payer: Self-pay

## 2013-10-02 ENCOUNTER — Other Ambulatory Visit: Payer: Self-pay

## 2013-10-02 DIAGNOSIS — Z1231 Encounter for screening mammogram for malignant neoplasm of breast: Secondary | ICD-10-CM

## 2013-11-13 ENCOUNTER — Ambulatory Visit
Admission: RE | Admit: 2013-11-13 | Discharge: 2013-11-13 | Disposition: A | Discharge status: Home / Self Care | Payer: BC Managed Care – PPO | Source: Ambulatory Visit

## 2013-11-13 DIAGNOSIS — Z1231 Encounter for screening mammogram for malignant neoplasm of breast: Secondary | ICD-10-CM

## 2013-11-22 ENCOUNTER — Other Ambulatory Visit: Payer: Self-pay

## 2014-04-24 ENCOUNTER — Other Ambulatory Visit: Payer: Self-pay | Admitting: Obstetrics and Gynecology

## 2014-04-24 DIAGNOSIS — Z01419 Encounter for gynecological examination (general) (routine) without abnormal findings: Secondary | ICD-10-CM | POA: Diagnosis not present

## 2014-04-24 DIAGNOSIS — Z124 Encounter for screening for malignant neoplasm of cervix: Secondary | ICD-10-CM | POA: Diagnosis not present

## 2014-04-28 LAB — CYTOLOGY - PAP

## 2014-05-14 DIAGNOSIS — Z87440 Personal history of urinary (tract) infections: Secondary | ICD-10-CM | POA: Diagnosis not present

## 2014-08-04 ENCOUNTER — Other Ambulatory Visit: Payer: Self-pay

## 2014-08-15 DIAGNOSIS — H40013 Open angle with borderline findings, low risk, bilateral: Secondary | ICD-10-CM | POA: Diagnosis not present

## 2014-08-15 DIAGNOSIS — H1852 Epithelial (juvenile) corneal dystrophy: Secondary | ICD-10-CM | POA: Diagnosis not present

## 2014-08-15 DIAGNOSIS — H2513 Age-related nuclear cataract, bilateral: Secondary | ICD-10-CM | POA: Diagnosis not present

## 2014-08-15 DIAGNOSIS — H43813 Vitreous degeneration, bilateral: Secondary | ICD-10-CM | POA: Diagnosis not present

## 2014-08-25 DIAGNOSIS — D225 Melanocytic nevi of trunk: Secondary | ICD-10-CM | POA: Diagnosis not present

## 2014-08-25 DIAGNOSIS — D1801 Hemangioma of skin and subcutaneous tissue: Secondary | ICD-10-CM | POA: Diagnosis not present

## 2014-08-25 DIAGNOSIS — L814 Other melanin hyperpigmentation: Secondary | ICD-10-CM | POA: Diagnosis not present

## 2014-08-25 DIAGNOSIS — L820 Inflamed seborrheic keratosis: Secondary | ICD-10-CM | POA: Diagnosis not present

## 2014-08-25 DIAGNOSIS — L821 Other seborrheic keratosis: Secondary | ICD-10-CM | POA: Diagnosis not present

## 2014-09-06 ENCOUNTER — Emergency Department (INDEPENDENT_AMBULATORY_CARE_PROVIDER_SITE_OTHER)
Admission: EM | Admit: 2014-09-06 | Discharge: 2014-09-06 | Disposition: A | Discharge status: Home / Self Care | Payer: Medicare Other | Source: Home / Self Care | Attending: Family Medicine | Admitting: Family Medicine

## 2014-09-06 ENCOUNTER — Encounter (HOSPITAL_COMMUNITY): Payer: Self-pay | Admitting: *Deleted

## 2014-09-06 DIAGNOSIS — K5901 Slow transit constipation: Secondary | ICD-10-CM

## 2014-09-06 MED ORDER — PEG 3350-KCL-NABCB-NACL-NASULF 236 G PO SOLR: 4000.0000 mL | Freq: Once | ORAL | Status: AC

## 2014-09-06 NOTE — ED Notes (Signed)
Pt  Reports  Low  abd  Pain  l  Side   With     Constipation  As   Well   Onset  Of  Symptoms  sev  Days  Ago - pt  Reports   Symptoms  Not  releived  By  Laxative  /  Enema          Pt  Reports  A  History  og f  Constipation as   Well  In the  Past

## 2014-09-06 NOTE — ED Provider Notes (Signed)
CSN: [id number]     Arrival date & time 09/06/14  1526 History   First MD Initiated Contact with Patient 09/06/14 1714     Chief Complaint  Patient presents with  . Constipation   (Consider location/radiation/quality/duration/timing/severity/associated sxs/prior Treatment) Patient is a 64 y.o. female presenting with constipation. The history is provided by the patient.  Constipation Severity:  Mild Time since last bowel movement:  2 days Progression:  Unchanged Chronicity:  Recurrent Context: dietary changes   Stool description:  Small Ineffective treatments:  Enemas Associated symptoms: flatus   Associated symptoms: no abdominal pain, no anorexia, no diarrhea, no fever, no nausea and no vomiting   Risk factors comment:  Had neg colonoscopy last year.has constip issues occas.   Past Medical History  Diagnosis Date  . Hyperlipidemia   . Endometrial cancer     Stage IB grade 2   Past Surgical History  Procedure Laterality Date  . Abdominal hysterectomy  04/2008    modified rad hyst, BSO, pelvic and periaortic lymphadenectomy and scar revision  . Exploratory laparotomy  1991    endometriosis  . Rectosigmoid resection with reanastomosis     Family History  Problem Relation Age of Onset  . Breast cancer Sister   . Cancer Sister   . Ovarian cancer Maternal Aunt   . Diabetes Mother   . Hypertension Mother   . Varicose Veins Mother   . Heart disease Father     before age 41  . Hyperlipidemia Father   . Hyperlipidemia Brother   . Hypertension Brother    History  Substance Use Topics  . Smoking status: Former Smoker -- 2 years  . Smokeless tobacco: Former Systems developer    Quit date: 02/08/1968  . Alcohol Use: Yes     Comment: occassional   OB History    No data available     Review of Systems  Constitutional: Negative.  Negative for fever.  Gastrointestinal: Positive for constipation and flatus. Negative for nausea, vomiting, abdominal pain, diarrhea, rectal pain and  anorexia.    Allergies  Sulfa antibiotics  Home Medications   Prior to Admission medications   Medication Sig Start Date End Date Taking? Authorizing Provider  Calcium Carbonate-Vitamin D (OSCAL 500/200 D-3 PO) Take 1 tablet by mouth daily.    Historical Provider, MD  Docusate Sodium (COLACE PO) Take by mouth as needed.     Historical Provider, MD  hydrocortisone (PROCTOSOL HC) 2.5 % rectal cream Place 1 application rectally as needed for hemorrhoids. 09/11/12   Melissa D Cross, NP  polyethylene glycol (GOLYTELY/NULYTELY) 236 G solution Take 4,000 mLs by mouth once. 09/06/14   Billy Fischer, MD  Polyethylene Glycol 3350 (MIRALAX PO) Take by mouth as needed.     Historical Provider, MD  Prednicarbate 0.1 % CREA  08/19/13   Historical Provider, MD  SIMVASTATIN PO Take 20 mg by mouth daily after supper.     Historical Provider, MD  Vitamin D, Ergocalciferol, (DRISDOL) 50000 UNITS CAPS capsule Take 50,000 Units by mouth every 7 (seven) days.    Historical Provider, MD   BP 154/96 mmHg  Pulse 81  Temp(Src) 98.3 F (36.8 C) (Oral)  Resp 18  SpO2 99% Physical Exam  Constitutional: She appears well-developed and well-nourished. No distress.  Abdominal: Soft. Bowel sounds are normal. She exhibits no distension and no mass. There is no tenderness. There is no rebound and no guarding.  Genitourinary: Rectum normal. Rectal exam shows no mass and no tenderness. Guaiac negative  stool.    Skin: Skin is warm and dry.  Nursing note and vitals reviewed.   ED Course  Procedures (including critical care time) Labs Review Labs Reviewed - No data to display  Imaging Review No results found.   MDM   1. Constipation due to slow transit        Billy Fischer, MD 09/06/14 1731

## 2014-10-14 ENCOUNTER — Other Ambulatory Visit: Payer: Self-pay

## 2014-10-14 DIAGNOSIS — Z1231 Encounter for screening mammogram for malignant neoplasm of breast: Secondary | ICD-10-CM

## 2014-10-17 DIAGNOSIS — L723 Sebaceous cyst: Secondary | ICD-10-CM | POA: Diagnosis not present

## 2014-10-30 DIAGNOSIS — Z23 Encounter for immunization: Secondary | ICD-10-CM | POA: Diagnosis not present

## 2014-11-18 ENCOUNTER — Ambulatory Visit
Admission: RE | Admit: 2014-11-18 | Discharge: 2014-11-18 | Disposition: A | Discharge status: Home / Self Care | Payer: Medicare Other | Source: Ambulatory Visit

## 2014-11-18 DIAGNOSIS — Z1231 Encounter for screening mammogram for malignant neoplasm of breast: Secondary | ICD-10-CM

## 2014-11-21 ENCOUNTER — Other Ambulatory Visit: Payer: Self-pay | Admitting: Obstetrics and Gynecology

## 2014-11-21 DIAGNOSIS — R319 Hematuria, unspecified: Secondary | ICD-10-CM | POA: Diagnosis not present

## 2014-11-21 DIAGNOSIS — L905 Scar conditions and fibrosis of skin: Secondary | ICD-10-CM | POA: Diagnosis not present

## 2014-11-21 DIAGNOSIS — N909 Noninflammatory disorder of vulva and perineum, unspecified: Secondary | ICD-10-CM | POA: Diagnosis not present

## 2014-11-21 DIAGNOSIS — N760 Acute vaginitis: Secondary | ICD-10-CM | POA: Diagnosis not present

## 2014-12-30 DIAGNOSIS — E7800 Pure hypercholesterolemia, unspecified: Secondary | ICD-10-CM | POA: Diagnosis not present

## 2014-12-30 DIAGNOSIS — M8588 Other specified disorders of bone density and structure, other site: Secondary | ICD-10-CM | POA: Diagnosis not present

## 2014-12-30 DIAGNOSIS — C541 Malignant neoplasm of endometrium: Secondary | ICD-10-CM | POA: Diagnosis not present

## 2014-12-30 DIAGNOSIS — Z23 Encounter for immunization: Secondary | ICD-10-CM | POA: Diagnosis not present

## 2014-12-30 DIAGNOSIS — E559 Vitamin D deficiency, unspecified: Secondary | ICD-10-CM | POA: Diagnosis not present

## 2014-12-30 DIAGNOSIS — Z0000 Encounter for general adult medical examination without abnormal findings: Secondary | ICD-10-CM | POA: Diagnosis not present

## 2015-03-10 DIAGNOSIS — M859 Disorder of bone density and structure, unspecified: Secondary | ICD-10-CM | POA: Diagnosis not present

## 2015-03-10 DIAGNOSIS — M8589 Other specified disorders of bone density and structure, multiple sites: Secondary | ICD-10-CM | POA: Diagnosis not present

## 2015-06-04 DIAGNOSIS — G5762 Lesion of plantar nerve, left lower limb: Secondary | ICD-10-CM | POA: Diagnosis not present

## 2015-06-04 DIAGNOSIS — G5761 Lesion of plantar nerve, right lower limb: Secondary | ICD-10-CM | POA: Diagnosis not present

## 2015-06-12 ENCOUNTER — Other Ambulatory Visit: Payer: Self-pay | Admitting: Obstetrics and Gynecology

## 2015-06-12 DIAGNOSIS — N959 Unspecified menopausal and perimenopausal disorder: Secondary | ICD-10-CM | POA: Diagnosis not present

## 2015-06-12 DIAGNOSIS — N952 Postmenopausal atrophic vaginitis: Secondary | ICD-10-CM | POA: Diagnosis not present

## 2015-06-12 DIAGNOSIS — Z8540 Personal history of malignant neoplasm of unspecified female genital organ: Secondary | ICD-10-CM | POA: Diagnosis not present

## 2015-06-16 LAB — CYTOLOGY - PAP

## 2015-06-30 DIAGNOSIS — M79672 Pain in left foot: Secondary | ICD-10-CM | POA: Diagnosis not present

## 2015-06-30 DIAGNOSIS — M2012 Hallux valgus (acquired), left foot: Secondary | ICD-10-CM | POA: Diagnosis not present

## 2015-07-20 ENCOUNTER — Other Ambulatory Visit: Payer: Self-pay | Admitting: Family Medicine

## 2015-07-20 ENCOUNTER — Ambulatory Visit
Admission: RE | Admit: 2015-07-20 | Discharge: 2015-07-20 | Disposition: A | Discharge status: Home / Self Care | Payer: Medicare Other | Source: Ambulatory Visit | Attending: Family Medicine | Admitting: Family Medicine

## 2015-07-20 DIAGNOSIS — R05 Cough: Secondary | ICD-10-CM | POA: Diagnosis not present

## 2015-07-20 DIAGNOSIS — J069 Acute upper respiratory infection, unspecified: Secondary | ICD-10-CM | POA: Diagnosis not present

## 2015-07-20 DIAGNOSIS — R059 Cough, unspecified: Secondary | ICD-10-CM

## 2015-08-24 DIAGNOSIS — L821 Other seborrheic keratosis: Secondary | ICD-10-CM | POA: Diagnosis not present

## 2015-08-24 DIAGNOSIS — D1801 Hemangioma of skin and subcutaneous tissue: Secondary | ICD-10-CM | POA: Diagnosis not present

## 2015-08-24 DIAGNOSIS — L718 Other rosacea: Secondary | ICD-10-CM | POA: Diagnosis not present

## 2015-08-24 DIAGNOSIS — D235 Other benign neoplasm of skin of trunk: Secondary | ICD-10-CM | POA: Diagnosis not present

## 2015-09-03 DIAGNOSIS — H04123 Dry eye syndrome of bilateral lacrimal glands: Secondary | ICD-10-CM | POA: Diagnosis not present

## 2015-09-03 DIAGNOSIS — H25013 Cortical age-related cataract, bilateral: Secondary | ICD-10-CM | POA: Diagnosis not present

## 2015-09-03 DIAGNOSIS — H2513 Age-related nuclear cataract, bilateral: Secondary | ICD-10-CM | POA: Diagnosis not present

## 2015-09-03 DIAGNOSIS — H40013 Open angle with borderline findings, low risk, bilateral: Secondary | ICD-10-CM | POA: Diagnosis not present

## 2015-09-15 ENCOUNTER — Encounter (HOSPITAL_COMMUNITY): Payer: Self-pay | Admitting: Emergency Medicine

## 2015-09-15 ENCOUNTER — Ambulatory Visit (HOSPITAL_COMMUNITY)
Admission: EM | Admit: 2015-09-15 | Discharge: 2015-09-15 | Disposition: A | Discharge status: Home / Self Care | Payer: Medicare Other | Attending: Family Medicine | Admitting: Family Medicine

## 2015-09-15 DIAGNOSIS — J40 Bronchitis, not specified as acute or chronic: Secondary | ICD-10-CM

## 2015-09-15 MED ORDER — AZITHROMYCIN 250 MG PO TABS: ORAL_TABLET | ORAL | 0 refills | Status: AC

## 2015-09-15 MED ORDER — BENZONATATE 100 MG PO CAPS: 200.0000 mg | ORAL_CAPSULE | Freq: Three times a day (TID) | ORAL | 0 refills | Status: AC | PRN

## 2015-09-15 NOTE — ED Provider Notes (Signed)
CSN: [id number]     Arrival date & time 09/15/15  1238 History   None    No chief complaint on file.  (Consider location/radiation/quality/duration/timing/severity/associated sxs/prior Treatment) The history is provided by the patient.  URI  Presenting symptoms: congestion and cough   Severity:  Moderate Onset quality:  Gradual Duration:  2 weeks Timing:  Intermittent Progression:  Waxing and waning Chronicity:  New Relieved by:  Nothing Worsened by:  Nothing Ineffective treatments:  OTC medications Associated symptoms: sneezing     Past Medical History:  Diagnosis Date  . Endometrial cancer    Stage IB grade 2  . Hyperlipidemia    Past Surgical History:  Procedure Laterality Date  . ABDOMINAL HYSTERECTOMY  04/2008   modified rad hyst, BSO, pelvic and periaortic lymphadenectomy and scar revision  . EXPLORATORY LAPAROTOMY  1991   endometriosis  . Rectosigmoid resection with reanastomosis     Family History  Problem Relation Age of Onset  . Breast cancer Sister   . Cancer Sister   . Ovarian cancer Maternal Aunt   . Diabetes Mother   . Hypertension Mother   . Varicose Veins Mother   . Heart disease Father     before age 58  . Hyperlipidemia Father   . Hyperlipidemia Brother   . Hypertension Brother    Social History  Substance Use Topics  . Smoking status: Former Smoker    Years: 2.00  . Smokeless tobacco: Former Systems developer    Quit date: 02/08/1968  . Alcohol use Yes     Comment: occassional   OB History    No data available     Review of Systems  Constitutional: Negative.   HENT: Positive for congestion and sneezing.   Eyes: Negative.   Respiratory: Positive for cough.   Cardiovascular: Negative.   Gastrointestinal: Negative.   Endocrine: Negative.   Genitourinary: Negative.   Musculoskeletal: Negative.   Skin: Negative.   Allergic/Immunologic: Negative.   Neurological: Negative.   Hematological: Negative.   Psychiatric/Behavioral: Negative.      Allergies  Sulfa antibiotics  Home Medications   Prior to Admission medications   Medication Sig Start Date End Date Taking? Authorizing Provider  Calcium Carbonate-Vitamin D (OSCAL 500/200 D-3 PO) Take 1 tablet by mouth daily.    Historical Provider, MD  Docusate Sodium (COLACE PO) Take by mouth as needed.     Historical Provider, MD  hydrocortisone (PROCTOSOL HC) 2.5 % rectal cream Place 1 application rectally as needed for hemorrhoids. 09/11/12   Melissa D Cross, NP  polyethylene glycol (GOLYTELY/NULYTELY) 236 G solution Take 4,000 mLs by mouth once. 09/06/14   Billy Fischer, MD  Polyethylene Glycol 3350 (MIRALAX PO) Take by mouth as needed.     Historical Provider, MD  Prednicarbate 0.1 % CREA  08/19/13   Historical Provider, MD  SIMVASTATIN PO Take 20 mg by mouth daily after supper.     Historical Provider, MD  Vitamin D, Ergocalciferol, (DRISDOL) 50000 UNITS CAPS capsule Take 50,000 Units by mouth every 7 (seven) days.    Historical Provider, MD   Meds Ordered and Administered this Visit  Medications - No data to display  BP 142/73 (BP Location: Left Arm)   Pulse 75   Temp 99 F (37.2 C) (Oral)   Resp 12   SpO2 100%  No data found.   Physical Exam  Constitutional: She appears well-developed and well-nourished.  HENT:  Head: Normocephalic and atraumatic.  Right Ear: External ear normal.  Left Ear:  External ear normal.  Mouth/Throat: Oropharynx is clear and moist.  Eyes: Conjunctivae and EOM are normal. Pupils are equal, round, and reactive to light.  Neck: Normal range of motion. Neck supple.  Cardiovascular: Normal rate and regular rhythm.   Pulmonary/Chest: Effort normal and breath sounds normal.  Abdominal: Soft. Bowel sounds are normal.  Nursing note and vitals reviewed.   Urgent Care Course   Clinical Course    Procedures (including critical care time)  Labs Review Labs Reviewed - No data to display  Imaging Review No results found.   Visual Acuity  Review  Right Eye Distance:   Left Eye Distance:   Bilateral Distance:    Right Eye Near:   Left Eye Near:    Bilateral Near:         MDM  Bronchitis  Z-pak as directed Tessalon perles 200mg  one po tid prn #21  Push po fluids, rest, tylenol and motrin otc prn as directed for fever, arthralgias, and myalgias.  Follow up prn if sx's continue or persist.    Lysbeth Penner, FNP 09/15/15 [place], [place] 09/15/15 1346

## 2015-09-15 NOTE — ED Triage Notes (Signed)
Cough and cold symptoms for 3 weeks.  Patient has seen dr Marisue Humble and was treated with steroids and did improve, but worsened again.  Patient continues to have cough and cold symptoms

## 2015-10-14 ENCOUNTER — Other Ambulatory Visit: Payer: Self-pay | Admitting: Family Medicine

## 2015-10-14 DIAGNOSIS — Z1231 Encounter for screening mammogram for malignant neoplasm of breast: Secondary | ICD-10-CM

## 2015-10-28 DIAGNOSIS — J309 Allergic rhinitis, unspecified: Secondary | ICD-10-CM | POA: Diagnosis not present

## 2015-10-28 DIAGNOSIS — R05 Cough: Secondary | ICD-10-CM | POA: Diagnosis not present

## 2015-10-28 DIAGNOSIS — J324 Chronic pansinusitis: Secondary | ICD-10-CM | POA: Diagnosis not present

## 2015-11-13 DIAGNOSIS — Z23 Encounter for immunization: Secondary | ICD-10-CM | POA: Diagnosis not present

## 2015-11-17 DIAGNOSIS — J329 Chronic sinusitis, unspecified: Secondary | ICD-10-CM | POA: Diagnosis not present

## 2015-11-17 DIAGNOSIS — J324 Chronic pansinusitis: Secondary | ICD-10-CM | POA: Diagnosis not present

## 2015-11-24 ENCOUNTER — Ambulatory Visit
Admission: RE | Admit: 2015-11-24 | Discharge: 2015-11-24 | Disposition: A | Discharge status: Home / Self Care | Payer: Medicare Other | Source: Ambulatory Visit | Attending: Family Medicine | Admitting: Family Medicine

## 2015-11-24 DIAGNOSIS — Z1231 Encounter for screening mammogram for malignant neoplasm of breast: Secondary | ICD-10-CM

## 2015-12-15 ENCOUNTER — Other Ambulatory Visit: Payer: Self-pay

## 2015-12-15 DIAGNOSIS — J322 Chronic ethmoidal sinusitis: Secondary | ICD-10-CM | POA: Diagnosis not present

## 2015-12-15 DIAGNOSIS — J329 Chronic sinusitis, unspecified: Secondary | ICD-10-CM | POA: Diagnosis not present

## 2015-12-15 DIAGNOSIS — J339 Nasal polyp, unspecified: Secondary | ICD-10-CM | POA: Diagnosis not present

## 2015-12-15 DIAGNOSIS — J324 Chronic pansinusitis: Secondary | ICD-10-CM | POA: Diagnosis not present

## 2015-12-15 DIAGNOSIS — J320 Chronic maxillary sinusitis: Secondary | ICD-10-CM | POA: Diagnosis not present

## 2015-12-15 DIAGNOSIS — J323 Chronic sphenoidal sinusitis: Secondary | ICD-10-CM | POA: Diagnosis not present

## 2015-12-15 DIAGNOSIS — J321 Chronic frontal sinusitis: Secondary | ICD-10-CM | POA: Diagnosis not present

## 2015-12-22 DIAGNOSIS — J324 Chronic pansinusitis: Secondary | ICD-10-CM | POA: Diagnosis not present

## 2015-12-28 DIAGNOSIS — J324 Chronic pansinusitis: Secondary | ICD-10-CM | POA: Diagnosis not present

## 2015-12-30 DIAGNOSIS — R197 Diarrhea, unspecified: Secondary | ICD-10-CM | POA: Diagnosis not present

## 2016-01-04 DIAGNOSIS — E7800 Pure hypercholesterolemia, unspecified: Secondary | ICD-10-CM | POA: Diagnosis not present

## 2016-01-04 DIAGNOSIS — Z0000 Encounter for general adult medical examination without abnormal findings: Secondary | ICD-10-CM | POA: Diagnosis not present

## 2016-01-04 DIAGNOSIS — E559 Vitamin D deficiency, unspecified: Secondary | ICD-10-CM | POA: Diagnosis not present

## 2016-01-04 DIAGNOSIS — C541 Malignant neoplasm of endometrium: Secondary | ICD-10-CM | POA: Diagnosis not present

## 2016-01-04 DIAGNOSIS — Z23 Encounter for immunization: Secondary | ICD-10-CM | POA: Diagnosis not present

## 2016-01-04 DIAGNOSIS — M8580 Other specified disorders of bone density and structure, unspecified site: Secondary | ICD-10-CM | POA: Diagnosis not present

## 2016-01-06 DIAGNOSIS — T881XXA Other complications following immunization, not elsewhere classified, initial encounter: Secondary | ICD-10-CM | POA: Diagnosis not present

## 2016-01-20 DIAGNOSIS — J324 Chronic pansinusitis: Secondary | ICD-10-CM | POA: Diagnosis not present

## 2016-02-22 DIAGNOSIS — M79672 Pain in left foot: Secondary | ICD-10-CM | POA: Diagnosis not present

## 2016-03-20 DIAGNOSIS — H6122 Impacted cerumen, left ear: Secondary | ICD-10-CM | POA: Diagnosis not present

## 2016-03-20 DIAGNOSIS — J309 Allergic rhinitis, unspecified: Secondary | ICD-10-CM | POA: Diagnosis not present

## 2016-04-14 DIAGNOSIS — J3089 Other allergic rhinitis: Secondary | ICD-10-CM | POA: Diagnosis not present

## 2016-04-14 DIAGNOSIS — J324 Chronic pansinusitis: Secondary | ICD-10-CM | POA: Diagnosis not present

## 2016-04-18 DIAGNOSIS — M2012 Hallux valgus (acquired), left foot: Secondary | ICD-10-CM | POA: Diagnosis not present

## 2016-04-18 DIAGNOSIS — M79672 Pain in left foot: Secondary | ICD-10-CM | POA: Diagnosis not present

## 2016-05-04 DIAGNOSIS — M24875 Other specific joint derangements left foot, not elsewhere classified: Secondary | ICD-10-CM | POA: Diagnosis not present

## 2016-05-04 DIAGNOSIS — M21612 Bunion of left foot: Secondary | ICD-10-CM | POA: Diagnosis not present

## 2016-05-04 DIAGNOSIS — G8918 Other acute postprocedural pain: Secondary | ICD-10-CM | POA: Diagnosis not present

## 2016-05-04 DIAGNOSIS — M2042 Other hammer toe(s) (acquired), left foot: Secondary | ICD-10-CM | POA: Diagnosis not present

## 2016-05-04 DIAGNOSIS — M2012 Hallux valgus (acquired), left foot: Secondary | ICD-10-CM | POA: Diagnosis not present

## 2016-05-17 DIAGNOSIS — M79672 Pain in left foot: Secondary | ICD-10-CM | POA: Diagnosis not present

## 2016-05-23 DIAGNOSIS — Z9889 Other specified postprocedural states: Secondary | ICD-10-CM | POA: Diagnosis not present

## 2016-06-06 DIAGNOSIS — M79672 Pain in left foot: Secondary | ICD-10-CM | POA: Diagnosis not present

## 2016-06-20 DIAGNOSIS — M79672 Pain in left foot: Secondary | ICD-10-CM | POA: Diagnosis not present

## 2016-07-12 DIAGNOSIS — M79672 Pain in left foot: Secondary | ICD-10-CM | POA: Diagnosis not present

## 2016-08-09 DIAGNOSIS — M79672 Pain in left foot: Secondary | ICD-10-CM | POA: Diagnosis not present

## 2016-08-16 DIAGNOSIS — M79672 Pain in left foot: Secondary | ICD-10-CM | POA: Diagnosis not present

## 2016-08-16 DIAGNOSIS — M25675 Stiffness of left foot, not elsewhere classified: Secondary | ICD-10-CM | POA: Diagnosis not present

## 2016-08-18 DIAGNOSIS — M25675 Stiffness of left foot, not elsewhere classified: Secondary | ICD-10-CM | POA: Diagnosis not present

## 2016-08-18 DIAGNOSIS — M79672 Pain in left foot: Secondary | ICD-10-CM | POA: Diagnosis not present

## 2016-08-22 ENCOUNTER — Other Ambulatory Visit: Payer: Self-pay | Admitting: Dermatology

## 2016-08-22 DIAGNOSIS — C4441 Basal cell carcinoma of skin of scalp and neck: Secondary | ICD-10-CM | POA: Diagnosis not present

## 2016-08-22 DIAGNOSIS — L821 Other seborrheic keratosis: Secondary | ICD-10-CM | POA: Diagnosis not present

## 2016-08-22 DIAGNOSIS — D225 Melanocytic nevi of trunk: Secondary | ICD-10-CM | POA: Diagnosis not present

## 2016-08-22 DIAGNOSIS — D485 Neoplasm of uncertain behavior of skin: Secondary | ICD-10-CM | POA: Diagnosis not present

## 2016-08-22 DIAGNOSIS — D1801 Hemangioma of skin and subcutaneous tissue: Secondary | ICD-10-CM | POA: Diagnosis not present

## 2016-08-22 DIAGNOSIS — L814 Other melanin hyperpigmentation: Secondary | ICD-10-CM | POA: Diagnosis not present

## 2016-08-24 DIAGNOSIS — N959 Unspecified menopausal and perimenopausal disorder: Secondary | ICD-10-CM | POA: Diagnosis not present

## 2016-08-24 DIAGNOSIS — M79672 Pain in left foot: Secondary | ICD-10-CM | POA: Diagnosis not present

## 2016-08-24 DIAGNOSIS — R300 Dysuria: Secondary | ICD-10-CM | POA: Diagnosis not present

## 2016-08-24 DIAGNOSIS — Z8540 Personal history of malignant neoplasm of unspecified female genital organ: Secondary | ICD-10-CM | POA: Diagnosis not present

## 2016-08-24 DIAGNOSIS — Z01419 Encounter for gynecological examination (general) (routine) without abnormal findings: Secondary | ICD-10-CM | POA: Diagnosis not present

## 2016-08-24 DIAGNOSIS — M25675 Stiffness of left foot, not elsewhere classified: Secondary | ICD-10-CM | POA: Diagnosis not present

## 2016-08-24 DIAGNOSIS — N952 Postmenopausal atrophic vaginitis: Secondary | ICD-10-CM | POA: Diagnosis not present

## 2016-08-26 DIAGNOSIS — M79672 Pain in left foot: Secondary | ICD-10-CM | POA: Diagnosis not present

## 2016-08-26 DIAGNOSIS — M25675 Stiffness of left foot, not elsewhere classified: Secondary | ICD-10-CM | POA: Diagnosis not present

## 2016-08-30 DIAGNOSIS — M79672 Pain in left foot: Secondary | ICD-10-CM | POA: Diagnosis not present

## 2016-08-30 DIAGNOSIS — M25675 Stiffness of left foot, not elsewhere classified: Secondary | ICD-10-CM | POA: Diagnosis not present

## 2016-09-01 DIAGNOSIS — C4441 Basal cell carcinoma of skin of scalp and neck: Secondary | ICD-10-CM | POA: Diagnosis not present

## 2016-09-01 DIAGNOSIS — M79672 Pain in left foot: Secondary | ICD-10-CM | POA: Diagnosis not present

## 2016-09-01 DIAGNOSIS — M25675 Stiffness of left foot, not elsewhere classified: Secondary | ICD-10-CM | POA: Diagnosis not present

## 2016-09-05 DIAGNOSIS — H40013 Open angle with borderline findings, low risk, bilateral: Secondary | ICD-10-CM | POA: Diagnosis not present

## 2016-09-05 DIAGNOSIS — H25013 Cortical age-related cataract, bilateral: Secondary | ICD-10-CM | POA: Diagnosis not present

## 2016-09-05 DIAGNOSIS — H35361 Drusen (degenerative) of macula, right eye: Secondary | ICD-10-CM | POA: Diagnosis not present

## 2016-09-05 DIAGNOSIS — H2513 Age-related nuclear cataract, bilateral: Secondary | ICD-10-CM | POA: Diagnosis not present

## 2016-09-06 DIAGNOSIS — M25675 Stiffness of left foot, not elsewhere classified: Secondary | ICD-10-CM | POA: Diagnosis not present

## 2016-09-06 DIAGNOSIS — M79672 Pain in left foot: Secondary | ICD-10-CM | POA: Diagnosis not present

## 2016-09-08 DIAGNOSIS — M25675 Stiffness of left foot, not elsewhere classified: Secondary | ICD-10-CM | POA: Diagnosis not present

## 2016-09-08 DIAGNOSIS — M79672 Pain in left foot: Secondary | ICD-10-CM | POA: Diagnosis not present

## 2016-09-12 DIAGNOSIS — M79672 Pain in left foot: Secondary | ICD-10-CM | POA: Diagnosis not present

## 2016-09-12 DIAGNOSIS — M25675 Stiffness of left foot, not elsewhere classified: Secondary | ICD-10-CM | POA: Diagnosis not present

## 2016-09-21 DIAGNOSIS — M79672 Pain in left foot: Secondary | ICD-10-CM | POA: Diagnosis not present

## 2016-09-21 DIAGNOSIS — M25675 Stiffness of left foot, not elsewhere classified: Secondary | ICD-10-CM | POA: Diagnosis not present

## 2016-10-03 DIAGNOSIS — M79672 Pain in left foot: Secondary | ICD-10-CM | POA: Diagnosis not present

## 2016-10-03 DIAGNOSIS — M25675 Stiffness of left foot, not elsewhere classified: Secondary | ICD-10-CM | POA: Diagnosis not present

## 2016-10-04 DIAGNOSIS — M25675 Stiffness of left foot, not elsewhere classified: Secondary | ICD-10-CM | POA: Diagnosis not present

## 2016-10-17 DIAGNOSIS — L905 Scar conditions and fibrosis of skin: Secondary | ICD-10-CM | POA: Diagnosis not present

## 2016-10-17 DIAGNOSIS — Z85828 Personal history of other malignant neoplasm of skin: Secondary | ICD-10-CM | POA: Diagnosis not present

## 2016-11-07 ENCOUNTER — Other Ambulatory Visit: Payer: Self-pay | Admitting: Family Medicine

## 2016-11-07 DIAGNOSIS — Z1231 Encounter for screening mammogram for malignant neoplasm of breast: Secondary | ICD-10-CM

## 2016-11-15 DIAGNOSIS — Z23 Encounter for immunization: Secondary | ICD-10-CM | POA: Diagnosis not present

## 2016-11-17 DIAGNOSIS — M79672 Pain in left foot: Secondary | ICD-10-CM | POA: Diagnosis not present

## 2016-11-24 ENCOUNTER — Ambulatory Visit
Admission: RE | Admit: 2016-11-24 | Discharge: 2016-11-24 | Disposition: A | Discharge status: Home / Self Care | Payer: Medicare Other | Source: Ambulatory Visit | Attending: Family Medicine | Admitting: Family Medicine

## 2016-11-24 DIAGNOSIS — Z1231 Encounter for screening mammogram for malignant neoplasm of breast: Secondary | ICD-10-CM

## 2017-01-06 DIAGNOSIS — M8580 Other specified disorders of bone density and structure, unspecified site: Secondary | ICD-10-CM | POA: Diagnosis not present

## 2017-01-06 DIAGNOSIS — Z0000 Encounter for general adult medical examination without abnormal findings: Secondary | ICD-10-CM | POA: Diagnosis not present

## 2017-01-06 DIAGNOSIS — Z8542 Personal history of malignant neoplasm of other parts of uterus: Secondary | ICD-10-CM | POA: Diagnosis not present

## 2017-01-06 DIAGNOSIS — E559 Vitamin D deficiency, unspecified: Secondary | ICD-10-CM | POA: Diagnosis not present

## 2017-01-06 DIAGNOSIS — Z1389 Encounter for screening for other disorder: Secondary | ICD-10-CM | POA: Diagnosis not present

## 2017-01-06 DIAGNOSIS — E7800 Pure hypercholesterolemia, unspecified: Secondary | ICD-10-CM | POA: Diagnosis not present

## 2017-01-06 DIAGNOSIS — R05 Cough: Secondary | ICD-10-CM | POA: Diagnosis not present

## 2017-02-06 DIAGNOSIS — L739 Follicular disorder, unspecified: Secondary | ICD-10-CM | POA: Diagnosis not present

## 2017-02-16 DIAGNOSIS — L309 Dermatitis, unspecified: Secondary | ICD-10-CM | POA: Diagnosis not present

## 2017-02-16 DIAGNOSIS — Z85828 Personal history of other malignant neoplasm of skin: Secondary | ICD-10-CM | POA: Diagnosis not present

## 2017-03-05 DIAGNOSIS — J209 Acute bronchitis, unspecified: Secondary | ICD-10-CM | POA: Diagnosis not present

## 2017-03-05 DIAGNOSIS — J069 Acute upper respiratory infection, unspecified: Secondary | ICD-10-CM | POA: Diagnosis not present

## 2017-03-09 DIAGNOSIS — R05 Cough: Secondary | ICD-10-CM | POA: Diagnosis not present

## 2017-03-09 DIAGNOSIS — J069 Acute upper respiratory infection, unspecified: Secondary | ICD-10-CM | POA: Diagnosis not present

## 2017-05-04 DIAGNOSIS — M810 Age-related osteoporosis without current pathological fracture: Secondary | ICD-10-CM | POA: Diagnosis not present

## 2017-05-04 DIAGNOSIS — M8588 Other specified disorders of bone density and structure, other site: Secondary | ICD-10-CM | POA: Diagnosis not present

## 2017-05-17 DIAGNOSIS — R05 Cough: Secondary | ICD-10-CM | POA: Diagnosis not present

## 2017-05-17 DIAGNOSIS — M810 Age-related osteoporosis without current pathological fracture: Secondary | ICD-10-CM | POA: Diagnosis not present

## 2017-06-16 DIAGNOSIS — K648 Other hemorrhoids: Secondary | ICD-10-CM | POA: Diagnosis not present

## 2017-08-03 DIAGNOSIS — H40013 Open angle with borderline findings, low risk, bilateral: Secondary | ICD-10-CM | POA: Diagnosis not present

## 2017-08-03 DIAGNOSIS — H04123 Dry eye syndrome of bilateral lacrimal glands: Secondary | ICD-10-CM | POA: Diagnosis not present

## 2017-08-03 DIAGNOSIS — H1013 Acute atopic conjunctivitis, bilateral: Secondary | ICD-10-CM | POA: Diagnosis not present

## 2017-08-03 DIAGNOSIS — H1852 Epithelial (juvenile) corneal dystrophy: Secondary | ICD-10-CM | POA: Diagnosis not present

## 2017-08-22 DIAGNOSIS — D229 Melanocytic nevi, unspecified: Secondary | ICD-10-CM | POA: Diagnosis not present

## 2017-08-22 DIAGNOSIS — Z85828 Personal history of other malignant neoplasm of skin: Secondary | ICD-10-CM | POA: Diagnosis not present

## 2017-08-22 DIAGNOSIS — L439 Lichen planus, unspecified: Secondary | ICD-10-CM | POA: Diagnosis not present

## 2017-08-22 DIAGNOSIS — L821 Other seborrheic keratosis: Secondary | ICD-10-CM | POA: Diagnosis not present

## 2017-08-22 DIAGNOSIS — D485 Neoplasm of uncertain behavior of skin: Secondary | ICD-10-CM | POA: Diagnosis not present

## 2017-09-04 DIAGNOSIS — Z124 Encounter for screening for malignant neoplasm of cervix: Secondary | ICD-10-CM | POA: Diagnosis not present

## 2017-09-04 DIAGNOSIS — N952 Postmenopausal atrophic vaginitis: Secondary | ICD-10-CM | POA: Diagnosis not present

## 2017-09-04 DIAGNOSIS — Z8542 Personal history of malignant neoplasm of other parts of uterus: Secondary | ICD-10-CM | POA: Diagnosis not present

## 2017-11-15 ENCOUNTER — Other Ambulatory Visit: Payer: Self-pay | Admitting: Family Medicine

## 2017-11-15 DIAGNOSIS — Z1231 Encounter for screening mammogram for malignant neoplasm of breast: Secondary | ICD-10-CM

## 2017-11-24 DIAGNOSIS — Z23 Encounter for immunization: Secondary | ICD-10-CM | POA: Diagnosis not present

## 2017-12-03 DIAGNOSIS — R1032 Left lower quadrant pain: Secondary | ICD-10-CM | POA: Diagnosis not present

## 2017-12-03 DIAGNOSIS — R319 Hematuria, unspecified: Secondary | ICD-10-CM | POA: Diagnosis not present

## 2017-12-04 ENCOUNTER — Emergency Department (HOSPITAL_COMMUNITY)
Admission: EM | Admit: 2017-12-04 | Discharge: 2017-12-04 | Disposition: A | Discharge status: Home / Self Care | Payer: Medicare Other | Attending: Emergency Medicine | Admitting: Emergency Medicine

## 2017-12-04 ENCOUNTER — Emergency Department (HOSPITAL_COMMUNITY): Payer: Medicare Other

## 2017-12-04 ENCOUNTER — Other Ambulatory Visit: Payer: Self-pay

## 2017-12-04 ENCOUNTER — Encounter (HOSPITAL_COMMUNITY): Payer: Self-pay

## 2017-12-04 DIAGNOSIS — E785 Hyperlipidemia, unspecified: Secondary | ICD-10-CM | POA: Insufficient documentation

## 2017-12-04 DIAGNOSIS — Z87891 Personal history of nicotine dependence: Secondary | ICD-10-CM | POA: Diagnosis not present

## 2017-12-04 DIAGNOSIS — R1032 Left lower quadrant pain: Secondary | ICD-10-CM | POA: Diagnosis not present

## 2017-12-04 DIAGNOSIS — Z79899 Other long term (current) drug therapy: Secondary | ICD-10-CM | POA: Diagnosis not present

## 2017-12-04 DIAGNOSIS — R109 Unspecified abdominal pain: Secondary | ICD-10-CM | POA: Diagnosis not present

## 2017-12-04 HISTORY — DX: Diverticulitis of intestine, part unspecified, without perforation or abscess without bleeding: K57.92

## 2017-12-04 LAB — URINALYSIS, ROUTINE W REFLEX MICROSCOPIC
BILIRUBIN URINE: NEGATIVE
GLUCOSE, UA: NEGATIVE mg/dL
KETONES UR: 20 mg/dL — AB
LEUKOCYTES UA: NEGATIVE
Nitrite: NEGATIVE
Protein, ur: NEGATIVE mg/dL
Specific Gravity, Urine: 1.02 (ref 1.005–1.030)
pH: 7 (ref 5.0–8.0)

## 2017-12-04 LAB — COMPREHENSIVE METABOLIC PANEL
ALT: 17 U/L (ref 0–44)
AST: 21 U/L (ref 15–41)
Albumin: 4.2 g/dL (ref 3.5–5.0)
Alkaline Phosphatase: 64 U/L (ref 38–126)
Anion gap: 9 (ref 5–15)
BUN: 10 mg/dL (ref 8–23)
CHLORIDE: 105 mmol/L (ref 98–111)
CO2: 26 mmol/L (ref 22–32)
CREATININE: 0.65 mg/dL (ref 0.44–1.00)
Calcium: 9.8 mg/dL (ref 8.9–10.3)
GFR calc Af Amer: >60 mL/min (ref >60)
GFR calc non Af Amer: >60 mL/min (ref >60)
Glucose, Bld: 108 mg/dL — ABNORMAL HIGH (ref 70–99)
POTASSIUM: 3.3 mmol/L — AB (ref 3.5–5.1)
SODIUM: 140 mmol/L (ref 135–145)
Total Bilirubin: 1.1 mg/dL (ref 0.3–1.2)
Total Protein: 7.3 g/dL (ref 6.5–8.1)

## 2017-12-04 LAB — LIPASE, BLOOD: LIPASE: 24 U/L (ref 11–51)

## 2017-12-04 LAB — CBC
HEMATOCRIT: 47.4 % — AB (ref 36.0–46.0)
HEMOGLOBIN: 15.9 g/dL — AB (ref 12.0–15.0)
MCH: 29.4 pg (ref 26.0–34.0)
MCHC: 33.5 g/dL (ref 30.0–36.0)
MCV: 87.6 fL (ref 80.0–100.0)
NRBC: 0 % (ref 0.0–0.2)
Platelets: 287 10*3/uL (ref 150–400)
RBC: 5.41 MIL/uL — ABNORMAL HIGH (ref 3.87–5.11)
RDW: 12.6 % (ref 11.5–15.5)
WBC: 6.6 10*3/uL (ref 4.0–10.5)

## 2017-12-04 MED ORDER — MORPHINE SULFATE (PF) 4 MG/ML IV SOLN
4.0000 mg | Freq: Once | INTRAVENOUS | Status: AC
  Administered 2017-12-04: 4 mg via INTRAVENOUS
  Filled 2017-12-04: qty 1

## 2017-12-04 MED ORDER — IOPAMIDOL (ISOVUE-300) INJECTION 61%
INTRAVENOUS | Status: AC
  Filled 2017-12-04: qty 100

## 2017-12-04 MED ORDER — IOPAMIDOL (ISOVUE-300) INJECTION 61%
100.0000 mL | Freq: Once | INTRAVENOUS | Status: AC | PRN
  Administered 2017-12-04: 100 mL via INTRAVENOUS

## 2017-12-04 MED ORDER — SODIUM CHLORIDE 0.9 % IJ SOLN
INTRAMUSCULAR | Status: AC
  Filled 2017-12-04: qty 50

## 2017-12-04 MED ORDER — SODIUM CHLORIDE 0.9 % IV BOLUS
1000.0000 mL | Freq: Once | INTRAVENOUS | Status: AC
  Administered 2017-12-04: 1000 mL via INTRAVENOUS

## 2017-12-04 MED ORDER — ONDANSETRON HCL 4 MG/2ML IJ SOLN
4.0000 mg | Freq: Once | INTRAMUSCULAR | Status: AC
  Administered 2017-12-04: 4 mg via INTRAVENOUS
  Filled 2017-12-04: qty 2

## 2017-12-04 NOTE — Discharge Instructions (Addendum)
Return here as needed for any worsening in your condition.  Follow-up as soon as possible with your doctor and your GI specialist.

## 2017-12-04 NOTE — ED Triage Notes (Signed)
Patient c/o LLQ pain x 2 days. Patient has a history of diverticulitis. Patient states she feels like she has not had a good BM in 2 days. Patient states she went to an UC and was given a bowel prep.

## 2017-12-08 NOTE — ED Provider Notes (Signed)
[place] DEPT Provider Note   CSN: [id number] Arrival date & time: 12/04/17  4098     History   Chief Complaint Chief Complaint  Patient presents with  . Abdominal Pain    HPI Debbie Pearson is a 67 y.o. female.  HPI Patient presents to the emergency department with left lower abdominal discomfort over the last 2 days.  Patient states she does have a history of diverticulitis and was concerned that may be causing her pain.  Patient states she has not had a good bowel movement in several days.  Patient states she was seen in urgent care yesterday and given a bowel prep to try to help facilitate a bowel movement.  Patient states that this did not seem to help.  Patient states she did not take any other medications prior to arrival for symptoms.  The patient denies chest pain, shortness of breath, headache,blurred vision, neck pain, fever, cough, weakness, numbness, dizziness, anorexia, edema,vomiting, diarrhea, rash, back pain, dysuria, hematemesis, bloody stool, near syncope, or syncope. Past Medical History:  Diagnosis Date  . Diverticulitis   . Endometrial cancer (HCC)    Stage IB grade 2  . Hyperlipidemia     Patient Active Problem List   Diagnosis Date Noted  . Varicose veins of lower extremities with other complications 11/91/4782  . Endometrial cancer ([place]) 09/30/2011    Past Surgical History:  Procedure Laterality Date  . ABDOMINAL HYSTERECTOMY  04/2008   modified rad hyst, BSO, pelvic and periaortic lymphadenectomy and scar revision  . EXPLORATORY LAPAROTOMY  1991   endometriosis  . HAMMER TOE SURGERY    . Rectosigmoid resection with reanastomosis       OB History   None      Home Medications    Prior to Admission medications   Medication Sig Start Date End Date Taking? Authorizing Provider  Calcium Carbonate-Vitamin D (OSCAL 500/200 D-3 PO) Take 1 tablet by mouth daily.   Yes [provider]  estradiol  (ESTRACE) 0.1 MG/GM vaginal cream Place 1 Applicatorful vaginally 2 (two) times a week. wednesday and sunday   Yes [provider]  simvastatin (ZOCOR) 20 MG tablet Take 20 mg by mouth daily.   Yes [provider]  Vitamin D, Ergocalciferol, (DRISDOL) 50000 UNITS CAPS capsule Take 50,000 Units by mouth every 7 (seven) days.   Yes [provider]  azithromycin (ZITHROMAX) 250 MG tablet Take 2 po first day and then one po qd x 4 days Patient not taking: Reported on 12/04/2017 09/15/15   Lysbeth Penner, FNP  benzonatate (TESSALON) 100 MG capsule Take 2 capsules (200 mg total) by mouth 3 (three) times daily as needed for cough. Patient not taking: Reported on 12/04/2017 09/15/15   Lysbeth Penner, FNP  hydrocortisone (PROCTOSOL HC) 2.5 % rectal cream Place 1 application rectally as needed for hemorrhoids. Patient not taking: Reported on 12/04/2017 09/11/12   Joylene John D, NP  polyethylene glycol (GOLYTELY/NULYTELY) 236 G solution Take 4,000 mLs by mouth once. Patient not taking: Reported on 12/04/2017 09/06/14   Billy Fischer, MD    Family History Family History  Problem Relation Age of Onset  . Breast cancer Sister   . Cancer Sister   . Ovarian cancer Maternal Aunt   . Diabetes Mother   . Hypertension Mother   . Varicose Veins Mother   . Heart disease Father        before age 24  . Hyperlipidemia Father   .  Hyperlipidemia Brother   . Hypertension Brother     Social History Social History   Tobacco Use  . Smoking status: Former Smoker    Years: 2.00  . Smokeless tobacco: Former Systems developer    Quit date: 02/08/1968  Substance Use Topics  . Alcohol use: Yes    Comment: occassional  . Drug use: No     Allergies   Erythromycin base; Fluticasone; and Sulfa antibiotics   Review of Systems Review of Systems All other systems negative except as documented in the HPI. All pertinent positives and negatives as reviewed in the HPI.  Physical Exam Updated  Vital Signs BP (!) 137/99   Pulse 75   Temp 98.2 F (36.8 C) (Oral)   Resp 18   Ht 5\' 4"  (1.626 m)   Wt 64.4 kg   SpO2 97%   BMI 24.37 kg/m   Physical Exam  Constitutional: She is oriented to person, place, and time. She appears well-developed and well-nourished. No distress.  HENT:  Head: Normocephalic and atraumatic.  Mouth/Throat: Oropharynx is clear and moist.  Eyes: Pupils are equal, round, and reactive to light.  Neck: Normal range of motion. Neck supple.  Cardiovascular: Normal rate, regular rhythm and normal heart sounds. Exam reveals no gallop and no friction rub.  No murmur heard. Pulmonary/Chest: Effort normal and breath sounds normal. No respiratory distress. She has no wheezes.  Abdominal: Soft. Bowel sounds are normal. She exhibits no distension. There is tenderness in the left lower quadrant. There is no rigidity and no guarding.  Neurological: She is alert and oriented to person, place, and time. She exhibits normal muscle tone. Coordination normal.  Skin: Skin is warm and dry. Capillary refill takes less than 2 seconds. No rash noted. No erythema.  Psychiatric: She has a normal mood and affect. Her behavior is normal.  Nursing note and vitals reviewed.    ED Treatments / Results  Labs (all labs ordered are listed, but only abnormal results are displayed) Labs Reviewed  COMPREHENSIVE METABOLIC PANEL - Abnormal; Notable for the following components:      Result Value   Potassium 3.3 (*)    Glucose, Bld 108 (*)    All other components within normal limits  CBC - Abnormal; Notable for the following components:   RBC 5.41 (*)    Hemoglobin 15.9 (*)    HCT 47.4 (*)    All other components within normal limits  URINALYSIS, ROUTINE W REFLEX MICROSCOPIC - Abnormal; Notable for the following components:   Hgb urine dipstick SMALL (*)    Ketones, ur 20 (*)    Bacteria, UA RARE (*)    All other components within normal limits  LIPASE, BLOOD     EKG None  Radiology No results found.  Procedures Procedures (including critical care time)  Medications Ordered in ED Medications  sodium chloride 0.9 % bolus 1,000 mL (0 mLs Intravenous Stopped 12/04/17 1105)  morphine 4 MG/ML injection 4 mg (4 mg Intravenous Given 12/04/17 0825)  ondansetron (ZOFRAN) injection 4 mg (4 mg Intravenous Given 12/04/17 0825)  iopamidol (ISOVUE-300) 61 % injection 100 mL (100 mLs Intravenous Contrast Given 12/04/17 0931)     Initial Impression / Assessment and Plan / ED Course  I have reviewed the triage vital signs and the nursing notes.  Pertinent labs & imaging results that were available during my care of the patient were reviewed by me and considered in my medical decision making (see chart for details).    Patient  was initially thought to maybe have a diverticulitis that was causing her discomfort.  CT scan did not show any signs of diverticulitis or other acute abnormality in the abdomen and pelvis.  Patient is advised the results and all questions were answered.  I did advise that she need to closely follow-up with her doctor and return for any worsening her condition.  I did advise this could be an evolving process that is yet to fully declare itself and that is why close monitoring of her symptoms will be vitally important.  Final Clinical Impressions(s) / ED Diagnoses   Final diagnoses:  Left lower quadrant abdominal pain    ED Discharge Orders    None       Dalia Heading, PA-C 12/08/17 2247    Blanchie Dessert, MD 12/09/17 2053

## 2017-12-15 ENCOUNTER — Ambulatory Visit
Admission: RE | Admit: 2017-12-15 | Discharge: 2017-12-15 | Disposition: A | Discharge status: Home / Self Care | Payer: Medicare Other | Source: Ambulatory Visit | Attending: Family Medicine | Admitting: Family Medicine

## 2017-12-15 DIAGNOSIS — Z1231 Encounter for screening mammogram for malignant neoplasm of breast: Secondary | ICD-10-CM

## 2018-01-02 DIAGNOSIS — K449 Diaphragmatic hernia without obstruction or gangrene: Secondary | ICD-10-CM | POA: Diagnosis not present

## 2018-01-02 DIAGNOSIS — Z86010 Personal history of colonic polyps: Secondary | ICD-10-CM | POA: Diagnosis not present

## 2018-01-02 DIAGNOSIS — K5900 Constipation, unspecified: Secondary | ICD-10-CM | POA: Diagnosis not present

## 2018-01-08 DIAGNOSIS — E7800 Pure hypercholesterolemia, unspecified: Secondary | ICD-10-CM | POA: Diagnosis not present

## 2018-01-08 DIAGNOSIS — Z1389 Encounter for screening for other disorder: Secondary | ICD-10-CM | POA: Diagnosis not present

## 2018-01-08 DIAGNOSIS — E559 Vitamin D deficiency, unspecified: Secondary | ICD-10-CM | POA: Diagnosis not present

## 2018-01-08 DIAGNOSIS — H6120 Impacted cerumen, unspecified ear: Secondary | ICD-10-CM | POA: Diagnosis not present

## 2018-01-08 DIAGNOSIS — M810 Age-related osteoporosis without current pathological fracture: Secondary | ICD-10-CM | POA: Diagnosis not present

## 2018-01-08 DIAGNOSIS — Z8542 Personal history of malignant neoplasm of other parts of uterus: Secondary | ICD-10-CM | POA: Diagnosis not present

## 2018-01-08 DIAGNOSIS — Z0000 Encounter for general adult medical examination without abnormal findings: Secondary | ICD-10-CM | POA: Diagnosis not present

## 2018-01-13 DIAGNOSIS — J029 Acute pharyngitis, unspecified: Secondary | ICD-10-CM | POA: Diagnosis not present

## 2018-02-19 DIAGNOSIS — H35363 Drusen (degenerative) of macula, bilateral: Secondary | ICD-10-CM | POA: Diagnosis not present

## 2018-02-19 DIAGNOSIS — H2513 Age-related nuclear cataract, bilateral: Secondary | ICD-10-CM | POA: Diagnosis not present

## 2018-02-19 DIAGNOSIS — H25013 Cortical age-related cataract, bilateral: Secondary | ICD-10-CM | POA: Diagnosis not present

## 2018-02-19 DIAGNOSIS — H40013 Open angle with borderline findings, low risk, bilateral: Secondary | ICD-10-CM | POA: Diagnosis not present

## 2018-02-26 DIAGNOSIS — Z86010 Personal history of colonic polyps: Secondary | ICD-10-CM | POA: Diagnosis not present

## 2018-02-26 DIAGNOSIS — D123 Benign neoplasm of transverse colon: Secondary | ICD-10-CM | POA: Diagnosis not present

## 2018-02-26 DIAGNOSIS — K5730 Diverticulosis of large intestine without perforation or abscess without bleeding: Secondary | ICD-10-CM | POA: Diagnosis not present

## 2018-02-26 DIAGNOSIS — D122 Benign neoplasm of ascending colon: Secondary | ICD-10-CM | POA: Diagnosis not present

## 2018-02-28 DIAGNOSIS — D122 Benign neoplasm of ascending colon: Secondary | ICD-10-CM | POA: Diagnosis not present

## 2018-02-28 DIAGNOSIS — D123 Benign neoplasm of transverse colon: Secondary | ICD-10-CM | POA: Diagnosis not present

## 2018-05-22 DIAGNOSIS — Z85828 Personal history of other malignant neoplasm of skin: Secondary | ICD-10-CM | POA: Diagnosis not present

## 2018-05-22 DIAGNOSIS — D485 Neoplasm of uncertain behavior of skin: Secondary | ICD-10-CM | POA: Diagnosis not present

## 2018-05-22 DIAGNOSIS — L905 Scar conditions and fibrosis of skin: Secondary | ICD-10-CM | POA: Diagnosis not present

## 2018-05-23 DIAGNOSIS — L989 Disorder of the skin and subcutaneous tissue, unspecified: Secondary | ICD-10-CM | POA: Diagnosis not present

## 2018-05-30 DIAGNOSIS — H04123 Dry eye syndrome of bilateral lacrimal glands: Secondary | ICD-10-CM | POA: Diagnosis not present

## 2018-05-30 DIAGNOSIS — H5319 Other subjective visual disturbances: Secondary | ICD-10-CM | POA: Diagnosis not present

## 2018-05-30 DIAGNOSIS — H35363 Drusen (degenerative) of macula, bilateral: Secondary | ICD-10-CM | POA: Diagnosis not present

## 2018-05-30 DIAGNOSIS — H1045 Other chronic allergic conjunctivitis: Secondary | ICD-10-CM | POA: Diagnosis not present

## 2018-07-26 DIAGNOSIS — M79671 Pain in right foot: Secondary | ICD-10-CM | POA: Diagnosis not present

## 2018-08-23 DIAGNOSIS — L821 Other seborrheic keratosis: Secondary | ICD-10-CM | POA: Diagnosis not present

## 2018-08-23 DIAGNOSIS — L814 Other melanin hyperpigmentation: Secondary | ICD-10-CM | POA: Diagnosis not present

## 2018-08-23 DIAGNOSIS — L819 Disorder of pigmentation, unspecified: Secondary | ICD-10-CM | POA: Diagnosis not present

## 2018-08-23 DIAGNOSIS — D229 Melanocytic nevi, unspecified: Secondary | ICD-10-CM | POA: Diagnosis not present

## 2018-08-23 DIAGNOSIS — Z85828 Personal history of other malignant neoplasm of skin: Secondary | ICD-10-CM | POA: Diagnosis not present

## 2018-08-27 ENCOUNTER — Other Ambulatory Visit: Payer: Self-pay | Admitting: Family Medicine

## 2018-08-27 DIAGNOSIS — Z1231 Encounter for screening mammogram for malignant neoplasm of breast: Secondary | ICD-10-CM

## 2018-10-02 DIAGNOSIS — Z124 Encounter for screening for malignant neoplasm of cervix: Secondary | ICD-10-CM | POA: Diagnosis not present

## 2018-10-02 DIAGNOSIS — Z8542 Personal history of malignant neoplasm of other parts of uterus: Secondary | ICD-10-CM | POA: Diagnosis not present

## 2018-10-02 DIAGNOSIS — C541 Malignant neoplasm of endometrium: Secondary | ICD-10-CM | POA: Diagnosis not present

## 2018-12-01 DIAGNOSIS — Z23 Encounter for immunization: Secondary | ICD-10-CM | POA: Diagnosis not present

## 2018-12-17 ENCOUNTER — Ambulatory Visit
Admission: RE | Admit: 2018-12-17 | Discharge: 2018-12-17 | Disposition: A | Discharge status: Home / Self Care | Payer: Medicare Other | Source: Ambulatory Visit | Attending: Family Medicine | Admitting: Family Medicine

## 2018-12-17 ENCOUNTER — Other Ambulatory Visit: Payer: Self-pay

## 2018-12-17 DIAGNOSIS — Z1231 Encounter for screening mammogram for malignant neoplasm of breast: Secondary | ICD-10-CM | POA: Diagnosis not present

## 2019-03-08 ENCOUNTER — Ambulatory Visit: Payer: Medicare Other

## 2019-03-14 DIAGNOSIS — H35363 Drusen (degenerative) of macula, bilateral: Secondary | ICD-10-CM | POA: Diagnosis not present

## 2019-03-14 DIAGNOSIS — H43813 Vitreous degeneration, bilateral: Secondary | ICD-10-CM | POA: Diagnosis not present

## 2019-03-14 DIAGNOSIS — H2513 Age-related nuclear cataract, bilateral: Secondary | ICD-10-CM | POA: Diagnosis not present

## 2019-03-14 DIAGNOSIS — H40013 Open angle with borderline findings, low risk, bilateral: Secondary | ICD-10-CM | POA: Diagnosis not present

## 2019-03-16 ENCOUNTER — Ambulatory Visit: Payer: Medicare Other | Attending: Internal Medicine

## 2019-03-16 DIAGNOSIS — Z23 Encounter for immunization: Secondary | ICD-10-CM | POA: Insufficient documentation

## 2019-03-16 NOTE — Progress Notes (Signed)
   Covid-19 Vaccination Clinic  Name:  Debbie Pearson    MRN: [id number] DOB: October 09, 1950  03/16/2019  Debbie Pearson was observed post Covid-19 immunization for 30 minutes based on pre-vaccination screening without incidence. She was provided with Vaccine Information Sheet and instruction to access the V-Safe system.   Debbie Pearson was instructed to call 911 with any severe reactions post vaccine: Marland Kitchen Difficulty breathing  . Swelling of your face and throat  . A fast heartbeat  . A bad rash all over your body  . Dizziness and weakness    Immunizations Administered    Name Date Dose VIS Date Route   Pfizer COVID-19 Vaccine 03/16/2019  8:38 AM 0.3 mL 01/18/2019 Intramuscular   Manufacturer: [place]   Lot: [id number]   [place]: [id number]

## 2019-04-10 ENCOUNTER — Ambulatory Visit: Payer: Medicare Other | Attending: Internal Medicine

## 2019-04-10 DIAGNOSIS — Z23 Encounter for immunization: Secondary | ICD-10-CM | POA: Insufficient documentation

## 2019-04-10 NOTE — Progress Notes (Signed)
   Covid-19 Vaccination Clinic  Name:  Francies Montagnino    MRN: [id number] DOB: [birth year]  04/10/2019  Ms. Bohmer was observed post Covid-19 immunization for 30 minutes based on pre-vaccination screening without incident. She was provided with Vaccine Information Sheet and instruction to access the V-Safe system.   Ms. Kohout was instructed to call 911 with any severe reactions post vaccine: Marland Kitchen Difficulty breathing  . Swelling of face and throat  . A fast heartbeat  . A bad rash all over body  . Dizziness and weakness   Immunizations Administered    Name Date Dose VIS Date Route   Pfizer COVID-19 Vaccine 04/10/2019  8:25 AM 0.3 mL 01/18/2019 Intramuscular   Manufacturer: [place]   Lot: [id number]   [place]: [id number]

## 2019-04-21 DIAGNOSIS — H6121 Impacted cerumen, right ear: Secondary | ICD-10-CM | POA: Diagnosis not present

## 2019-04-21 DIAGNOSIS — H6980 Other specified disorders of Eustachian tube, unspecified ear: Secondary | ICD-10-CM | POA: Diagnosis not present

## 2019-08-29 DIAGNOSIS — L905 Scar conditions and fibrosis of skin: Secondary | ICD-10-CM | POA: Diagnosis not present

## 2019-08-29 DIAGNOSIS — L570 Actinic keratosis: Secondary | ICD-10-CM | POA: Diagnosis not present

## 2019-08-29 DIAGNOSIS — L821 Other seborrheic keratosis: Secondary | ICD-10-CM | POA: Diagnosis not present

## 2019-08-29 DIAGNOSIS — D229 Melanocytic nevi, unspecified: Secondary | ICD-10-CM | POA: Diagnosis not present

## 2019-08-29 DIAGNOSIS — Z85828 Personal history of other malignant neoplasm of skin: Secondary | ICD-10-CM | POA: Diagnosis not present

## 2019-08-29 DIAGNOSIS — D1801 Hemangioma of skin and subcutaneous tissue: Secondary | ICD-10-CM | POA: Diagnosis not present

## 2019-08-29 DIAGNOSIS — L814 Other melanin hyperpigmentation: Secondary | ICD-10-CM | POA: Diagnosis not present

## 2019-08-29 DIAGNOSIS — I8393 Asymptomatic varicose veins of bilateral lower extremities: Secondary | ICD-10-CM | POA: Diagnosis not present

## 2019-09-16 DIAGNOSIS — H40013 Open angle with borderline findings, low risk, bilateral: Secondary | ICD-10-CM | POA: Diagnosis not present

## 2019-09-20 DIAGNOSIS — L239 Allergic contact dermatitis, unspecified cause: Secondary | ICD-10-CM | POA: Diagnosis not present

## 2019-10-11 DIAGNOSIS — Z01419 Encounter for gynecological examination (general) (routine) without abnormal findings: Secondary | ICD-10-CM | POA: Diagnosis not present

## 2019-10-11 DIAGNOSIS — Z8542 Personal history of malignant neoplasm of other parts of uterus: Secondary | ICD-10-CM | POA: Diagnosis not present

## 2019-10-30 ENCOUNTER — Other Ambulatory Visit: Payer: Self-pay | Admitting: Family Medicine

## 2019-10-30 DIAGNOSIS — Z1231 Encounter for screening mammogram for malignant neoplasm of breast: Secondary | ICD-10-CM

## 2019-11-08 DIAGNOSIS — L900 Lichen sclerosus et atrophicus: Secondary | ICD-10-CM | POA: Diagnosis not present

## 2019-11-16 DIAGNOSIS — Z23 Encounter for immunization: Secondary | ICD-10-CM | POA: Diagnosis not present

## 2019-12-18 ENCOUNTER — Ambulatory Visit
Admission: RE | Admit: 2019-12-18 | Discharge: 2019-12-18 | Disposition: A | Discharge status: Home / Self Care | Payer: Medicare Other | Source: Ambulatory Visit | Attending: Family Medicine | Admitting: Family Medicine

## 2019-12-18 ENCOUNTER — Other Ambulatory Visit: Payer: Self-pay

## 2019-12-18 DIAGNOSIS — Z1231 Encounter for screening mammogram for malignant neoplasm of breast: Secondary | ICD-10-CM | POA: Diagnosis not present

## 2019-12-28 DIAGNOSIS — Z23 Encounter for immunization: Secondary | ICD-10-CM | POA: Diagnosis not present

## 2020-01-20 DIAGNOSIS — E559 Vitamin D deficiency, unspecified: Secondary | ICD-10-CM | POA: Diagnosis not present

## 2020-01-20 DIAGNOSIS — Z8542 Personal history of malignant neoplasm of other parts of uterus: Secondary | ICD-10-CM | POA: Diagnosis not present

## 2020-01-20 DIAGNOSIS — E7800 Pure hypercholesterolemia, unspecified: Secondary | ICD-10-CM | POA: Diagnosis not present

## 2020-01-20 DIAGNOSIS — M810 Age-related osteoporosis without current pathological fracture: Secondary | ICD-10-CM | POA: Diagnosis not present

## 2020-01-20 DIAGNOSIS — Z1389 Encounter for screening for other disorder: Secondary | ICD-10-CM | POA: Diagnosis not present

## 2020-01-20 DIAGNOSIS — H6120 Impacted cerumen, unspecified ear: Secondary | ICD-10-CM | POA: Diagnosis not present

## 2020-01-20 DIAGNOSIS — Z0000 Encounter for general adult medical examination without abnormal findings: Secondary | ICD-10-CM | POA: Diagnosis not present

## 2020-02-12 DIAGNOSIS — L900 Lichen sclerosus et atrophicus: Secondary | ICD-10-CM | POA: Diagnosis not present

## 2020-03-18 DIAGNOSIS — H35363 Drusen (degenerative) of macula, bilateral: Secondary | ICD-10-CM | POA: Diagnosis not present

## 2020-03-18 DIAGNOSIS — H25013 Cortical age-related cataract, bilateral: Secondary | ICD-10-CM | POA: Diagnosis not present

## 2020-03-18 DIAGNOSIS — H40013 Open angle with borderline findings, low risk, bilateral: Secondary | ICD-10-CM | POA: Diagnosis not present

## 2020-03-18 DIAGNOSIS — H2513 Age-related nuclear cataract, bilateral: Secondary | ICD-10-CM | POA: Diagnosis not present

## 2020-04-09 DIAGNOSIS — L308 Other specified dermatitis: Secondary | ICD-10-CM | POA: Diagnosis not present

## 2020-04-09 DIAGNOSIS — D485 Neoplasm of uncertain behavior of skin: Secondary | ICD-10-CM | POA: Diagnosis not present

## 2020-04-09 DIAGNOSIS — L820 Inflamed seborrheic keratosis: Secondary | ICD-10-CM | POA: Diagnosis not present

## 2020-05-28 DIAGNOSIS — L308 Other specified dermatitis: Secondary | ICD-10-CM | POA: Diagnosis not present

## 2020-05-28 DIAGNOSIS — L905 Scar conditions and fibrosis of skin: Secondary | ICD-10-CM | POA: Diagnosis not present

## 2020-05-28 DIAGNOSIS — L820 Inflamed seborrheic keratosis: Secondary | ICD-10-CM | POA: Diagnosis not present

## 2020-06-09 IMAGING — MG DIGITAL SCREENING BILATERAL MAMMOGRAM WITH TOMO AND CAD
8 series · 8 of 24 positions shown · non-contrast
Comparison: Previous exam(s).

CLINICAL DATA: Screening.

EXAM:
DIGITAL SCREENING BILATERAL MAMMOGRAM WITH TOMO AND CAD

[R CC synth-2D]
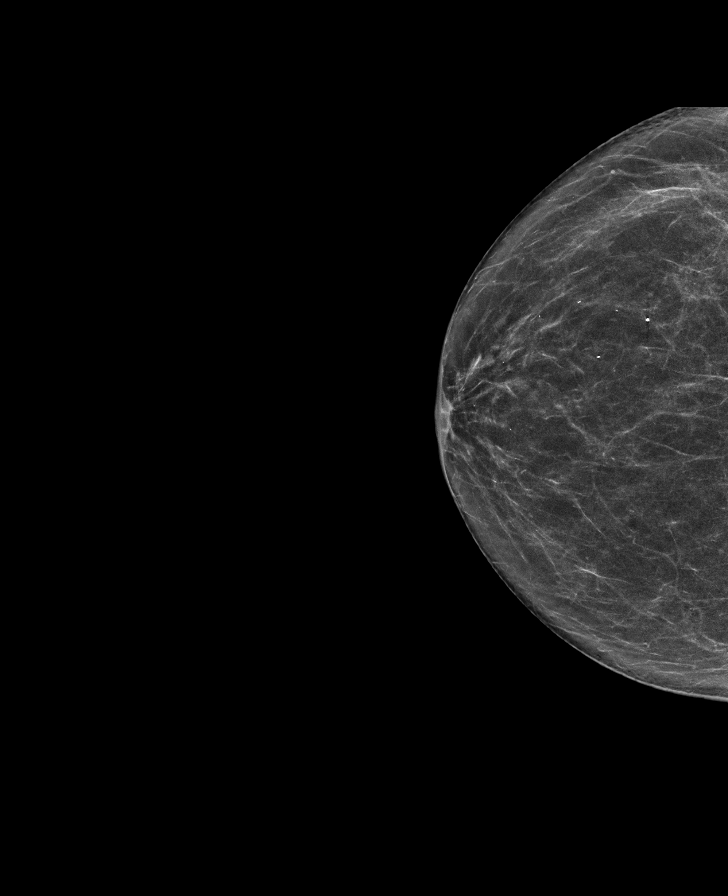

[L MLO synth-2D]
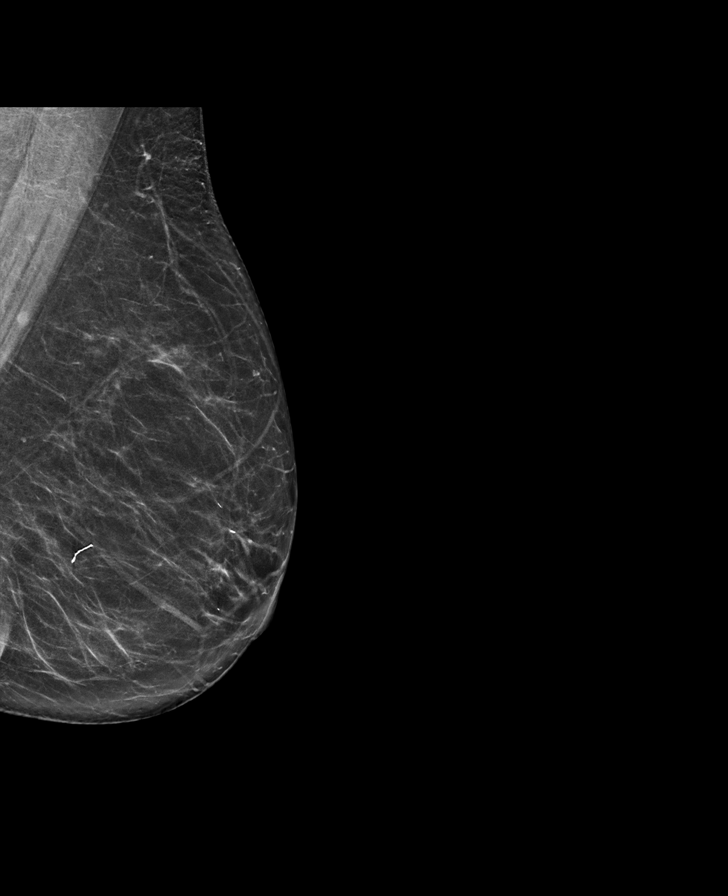

[L CC synth-2D]
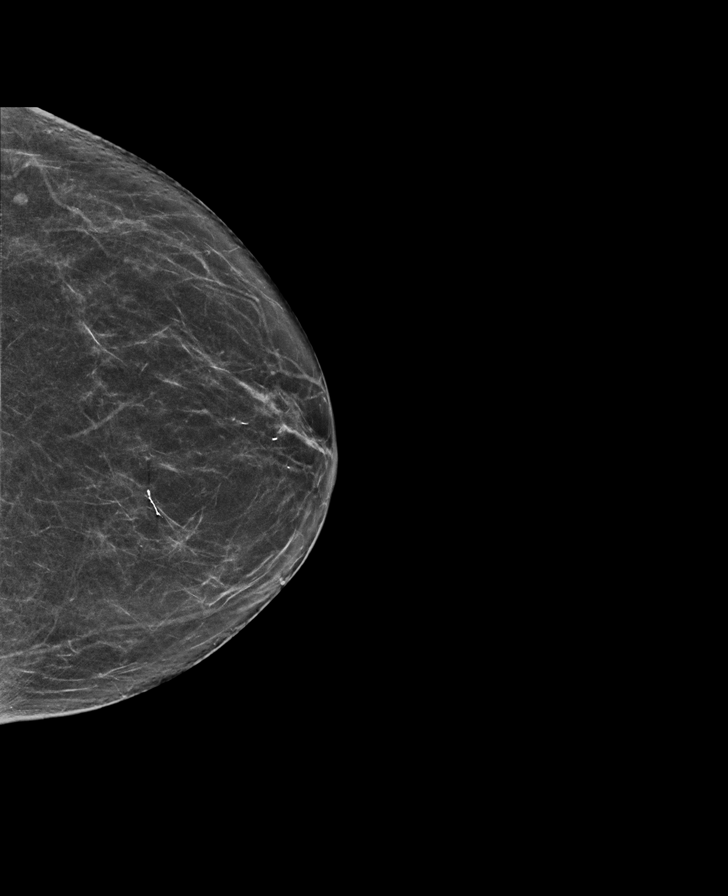

[R MLO synth-2D]
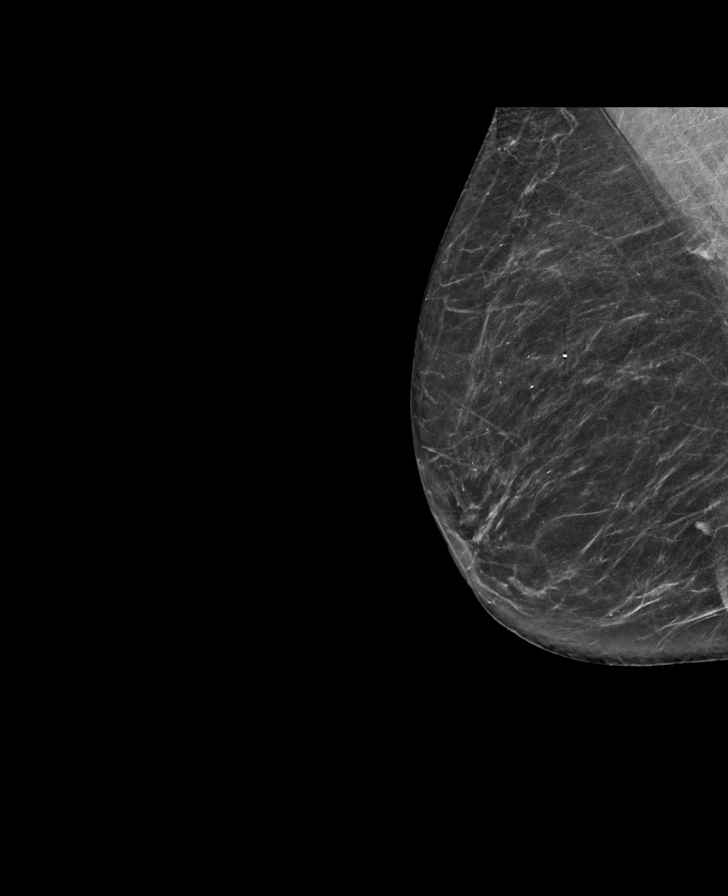

[L MLO tomo · tomo slice 35/69.0]
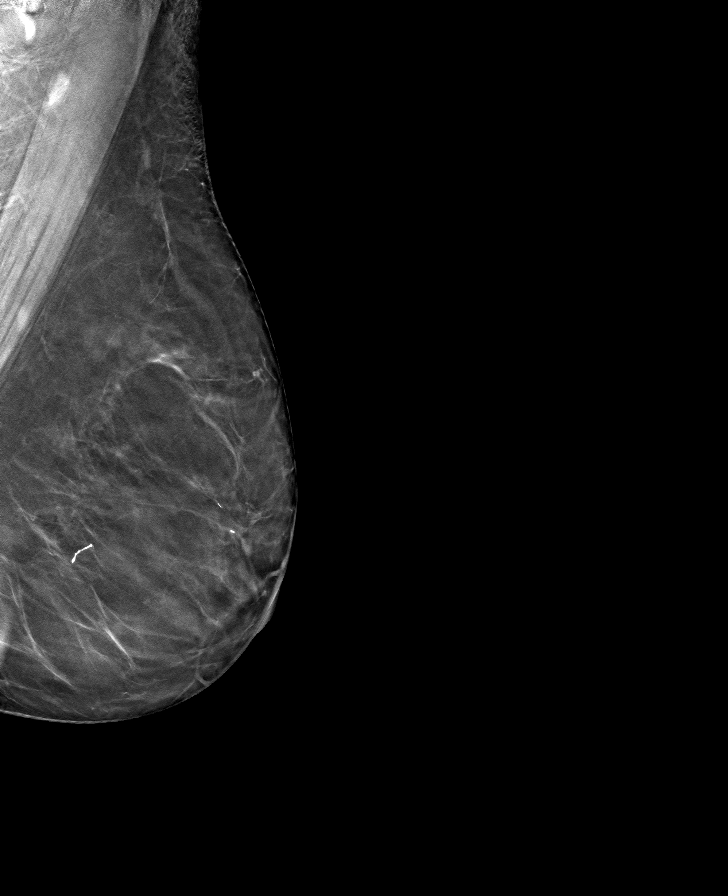

[R CC tomo · tomo slice 33/64.0]
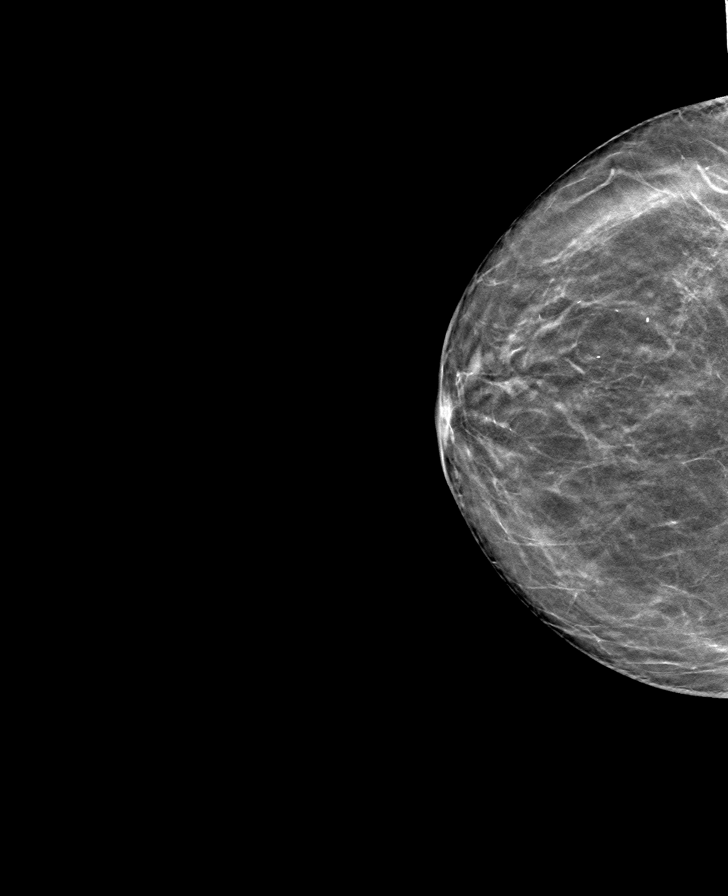

[R MLO tomo · tomo slice 33/65.0]
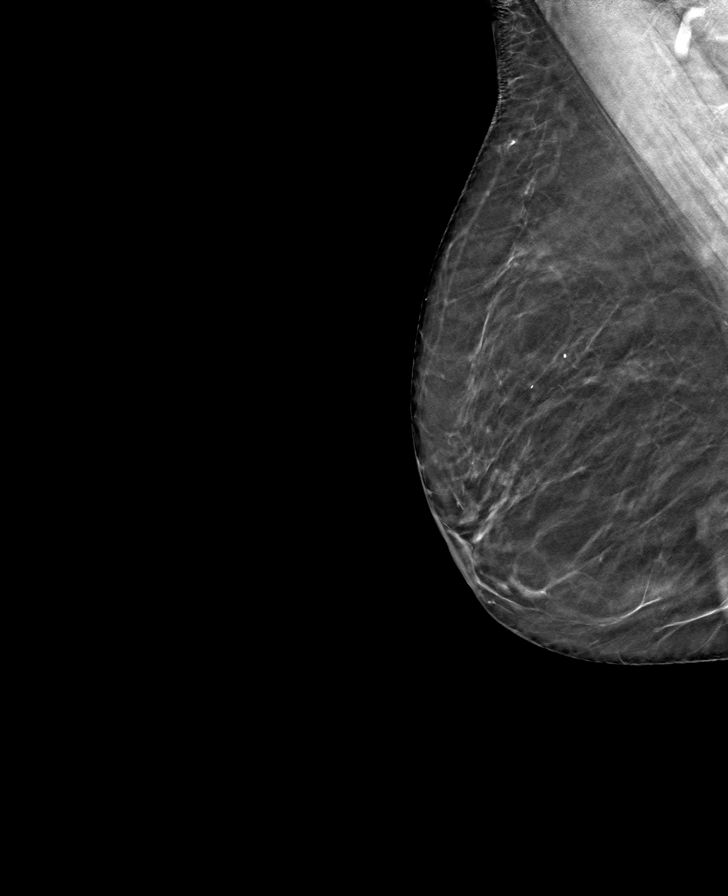

[L CC tomo · tomo slice 32/63.0]
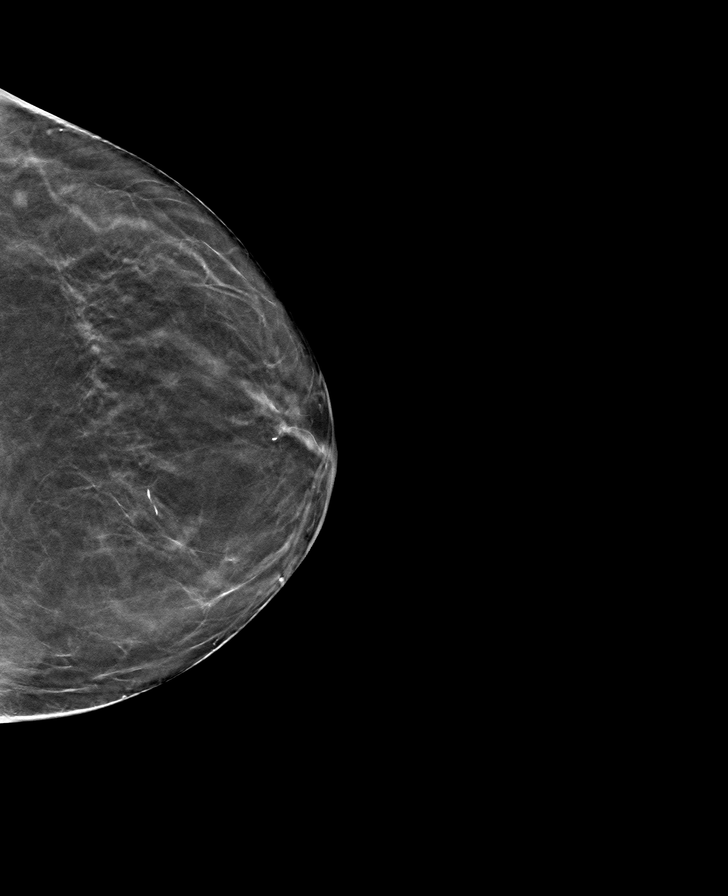

[8 of 24 positions shown; findings below may reference images not displayed]

ACR Breast Density Category b: There are scattered areas of
fibroglandular density.
FINDINGS: There are no findings suspicious for malignancy. Images were
processed with CAD.
IMPRESSION: No mammographic evidence of malignancy. A result letter of this
screening mammogram will be mailed directly to the patient.

RECOMMENDATION:
Screening mammogram in one year. (Code:[id number])

BI-RADS CATEGORY  1: Negative.

## 2020-08-16 DIAGNOSIS — Z23 Encounter for immunization: Secondary | ICD-10-CM | POA: Diagnosis not present

## 2020-08-31 ENCOUNTER — Other Ambulatory Visit: Payer: Self-pay | Admitting: Family Medicine

## 2020-08-31 DIAGNOSIS — Z1231 Encounter for screening mammogram for malignant neoplasm of breast: Secondary | ICD-10-CM

## 2020-09-03 DIAGNOSIS — D229 Melanocytic nevi, unspecified: Secondary | ICD-10-CM | POA: Diagnosis not present

## 2020-09-03 DIAGNOSIS — L814 Other melanin hyperpigmentation: Secondary | ICD-10-CM | POA: Diagnosis not present

## 2020-09-03 DIAGNOSIS — L821 Other seborrheic keratosis: Secondary | ICD-10-CM | POA: Diagnosis not present

## 2020-09-03 DIAGNOSIS — L819 Disorder of pigmentation, unspecified: Secondary | ICD-10-CM | POA: Diagnosis not present

## 2020-11-21 DIAGNOSIS — Z23 Encounter for immunization: Secondary | ICD-10-CM | POA: Diagnosis not present

## 2020-11-25 DIAGNOSIS — U071 COVID-19: Secondary | ICD-10-CM | POA: Diagnosis not present

## 2020-12-17 DIAGNOSIS — H6122 Impacted cerumen, left ear: Secondary | ICD-10-CM | POA: Diagnosis not present

## 2020-12-21 ENCOUNTER — Ambulatory Visit
Admission: RE | Admit: 2020-12-21 | Discharge: 2020-12-21 | Disposition: A | Discharge status: Home / Self Care | Payer: Medicare Other | Source: Ambulatory Visit | Attending: Family Medicine | Admitting: Family Medicine

## 2020-12-21 DIAGNOSIS — Z1231 Encounter for screening mammogram for malignant neoplasm of breast: Secondary | ICD-10-CM

## 2020-12-22 DIAGNOSIS — Z8542 Personal history of malignant neoplasm of other parts of uterus: Secondary | ICD-10-CM | POA: Diagnosis not present

## 2020-12-22 DIAGNOSIS — L900 Lichen sclerosus et atrophicus: Secondary | ICD-10-CM | POA: Diagnosis not present

## 2020-12-22 DIAGNOSIS — N951 Menopausal and female climacteric states: Secondary | ICD-10-CM | POA: Diagnosis not present

## 2020-12-25 DIAGNOSIS — Z23 Encounter for immunization: Secondary | ICD-10-CM | POA: Diagnosis not present

## 2021-01-21 ENCOUNTER — Other Ambulatory Visit: Payer: Self-pay | Admitting: Family Medicine

## 2021-01-21 DIAGNOSIS — Z8739 Personal history of other diseases of the musculoskeletal system and connective tissue: Secondary | ICD-10-CM

## 2021-01-21 DIAGNOSIS — Z8542 Personal history of malignant neoplasm of other parts of uterus: Secondary | ICD-10-CM | POA: Diagnosis not present

## 2021-01-21 DIAGNOSIS — Z0000 Encounter for general adult medical examination without abnormal findings: Secondary | ICD-10-CM | POA: Diagnosis not present

## 2021-01-21 DIAGNOSIS — Z79899 Other long term (current) drug therapy: Secondary | ICD-10-CM | POA: Diagnosis not present

## 2021-01-21 DIAGNOSIS — E559 Vitamin D deficiency, unspecified: Secondary | ICD-10-CM | POA: Diagnosis not present

## 2021-01-21 DIAGNOSIS — E7800 Pure hypercholesterolemia, unspecified: Secondary | ICD-10-CM | POA: Diagnosis not present

## 2021-01-21 DIAGNOSIS — Z1389 Encounter for screening for other disorder: Secondary | ICD-10-CM | POA: Diagnosis not present

## 2021-01-21 DIAGNOSIS — M810 Age-related osteoporosis without current pathological fracture: Secondary | ICD-10-CM | POA: Diagnosis not present

## 2021-01-28 DIAGNOSIS — M25561 Pain in right knee: Secondary | ICD-10-CM | POA: Diagnosis not present

## 2021-03-11 DIAGNOSIS — U071 COVID-19: Secondary | ICD-10-CM | POA: Diagnosis not present

## 2021-03-22 DIAGNOSIS — H40013 Open angle with borderline findings, low risk, bilateral: Secondary | ICD-10-CM | POA: Diagnosis not present

## 2021-03-22 DIAGNOSIS — H2513 Age-related nuclear cataract, bilateral: Secondary | ICD-10-CM | POA: Diagnosis not present

## 2021-03-22 DIAGNOSIS — H35363 Drusen (degenerative) of macula, bilateral: Secondary | ICD-10-CM | POA: Diagnosis not present

## 2021-03-22 DIAGNOSIS — H25013 Cortical age-related cataract, bilateral: Secondary | ICD-10-CM | POA: Diagnosis not present

## 2021-06-24 ENCOUNTER — Ambulatory Visit
Admission: RE | Admit: 2021-06-24 | Discharge: 2021-06-24 | Disposition: A | Discharge status: Home / Self Care | Payer: Medicare Other | Source: Ambulatory Visit | Attending: Family Medicine | Admitting: Family Medicine

## 2021-06-24 DIAGNOSIS — M8589 Other specified disorders of bone density and structure, multiple sites: Secondary | ICD-10-CM | POA: Diagnosis not present

## 2021-06-24 DIAGNOSIS — Z8739 Personal history of other diseases of the musculoskeletal system and connective tissue: Secondary | ICD-10-CM

## 2021-06-24 DIAGNOSIS — Z780 Asymptomatic menopausal state: Secondary | ICD-10-CM | POA: Diagnosis not present

## 2021-08-20 ENCOUNTER — Other Ambulatory Visit: Payer: Self-pay | Admitting: Family Medicine

## 2021-08-20 DIAGNOSIS — Z1231 Encounter for screening mammogram for malignant neoplasm of breast: Secondary | ICD-10-CM

## 2021-09-06 DIAGNOSIS — D225 Melanocytic nevi of trunk: Secondary | ICD-10-CM | POA: Diagnosis not present

## 2021-09-06 DIAGNOSIS — L814 Other melanin hyperpigmentation: Secondary | ICD-10-CM | POA: Diagnosis not present

## 2021-09-06 DIAGNOSIS — Z85828 Personal history of other malignant neoplasm of skin: Secondary | ICD-10-CM | POA: Diagnosis not present

## 2021-09-06 DIAGNOSIS — L821 Other seborrheic keratosis: Secondary | ICD-10-CM | POA: Diagnosis not present

## 2021-09-06 DIAGNOSIS — L718 Other rosacea: Secondary | ICD-10-CM | POA: Diagnosis not present

## 2021-09-06 DIAGNOSIS — Z08 Encounter for follow-up examination after completed treatment for malignant neoplasm: Secondary | ICD-10-CM | POA: Diagnosis not present

## 2021-09-27 DIAGNOSIS — H40013 Open angle with borderline findings, low risk, bilateral: Secondary | ICD-10-CM | POA: Diagnosis not present

## 2021-11-27 DIAGNOSIS — Z23 Encounter for immunization: Secondary | ICD-10-CM | POA: Diagnosis not present

## 2021-12-22 ENCOUNTER — Ambulatory Visit
Admission: RE | Admit: 2021-12-22 | Discharge: 2021-12-22 | Disposition: A | Discharge status: Home / Self Care | Payer: Medicare Other | Source: Ambulatory Visit | Attending: Family Medicine | Admitting: Family Medicine

## 2021-12-22 DIAGNOSIS — Z1231 Encounter for screening mammogram for malignant neoplasm of breast: Secondary | ICD-10-CM

## 2021-12-23 DIAGNOSIS — Z23 Encounter for immunization: Secondary | ICD-10-CM | POA: Diagnosis not present

## 2022-01-25 DIAGNOSIS — E7800 Pure hypercholesterolemia, unspecified: Secondary | ICD-10-CM | POA: Diagnosis not present

## 2022-01-25 DIAGNOSIS — Z8542 Personal history of malignant neoplasm of other parts of uterus: Secondary | ICD-10-CM | POA: Diagnosis not present

## 2022-01-25 DIAGNOSIS — Z0000 Encounter for general adult medical examination without abnormal findings: Secondary | ICD-10-CM | POA: Diagnosis not present

## 2022-01-25 DIAGNOSIS — Z79899 Other long term (current) drug therapy: Secondary | ICD-10-CM | POA: Diagnosis not present

## 2022-01-25 DIAGNOSIS — M8580 Other specified disorders of bone density and structure, unspecified site: Secondary | ICD-10-CM | POA: Diagnosis not present

## 2022-01-25 DIAGNOSIS — Z1331 Encounter for screening for depression: Secondary | ICD-10-CM | POA: Diagnosis not present

## 2022-01-25 DIAGNOSIS — E559 Vitamin D deficiency, unspecified: Secondary | ICD-10-CM | POA: Diagnosis not present

## 2022-02-28 ENCOUNTER — Other Ambulatory Visit: Payer: Self-pay | Admitting: Family Medicine

## 2022-02-28 DIAGNOSIS — Z1231 Encounter for screening mammogram for malignant neoplasm of breast: Secondary | ICD-10-CM

## 2022-04-01 DIAGNOSIS — H18513 Endothelial corneal dystrophy, bilateral: Secondary | ICD-10-CM | POA: Diagnosis not present

## 2022-04-01 DIAGNOSIS — H40013 Open angle with borderline findings, low risk, bilateral: Secondary | ICD-10-CM | POA: Diagnosis not present

## 2022-04-01 DIAGNOSIS — H04123 Dry eye syndrome of bilateral lacrimal glands: Secondary | ICD-10-CM | POA: Diagnosis not present

## 2022-04-01 DIAGNOSIS — H25813 Combined forms of age-related cataract, bilateral: Secondary | ICD-10-CM | POA: Diagnosis not present

## 2022-06-20 DIAGNOSIS — M79604 Pain in right leg: Secondary | ICD-10-CM | POA: Diagnosis not present

## 2022-07-25 DIAGNOSIS — K5900 Constipation, unspecified: Secondary | ICD-10-CM | POA: Diagnosis not present

## 2022-08-27 DIAGNOSIS — J029 Acute pharyngitis, unspecified: Secondary | ICD-10-CM | POA: Diagnosis not present

## 2022-08-27 DIAGNOSIS — R509 Fever, unspecified: Secondary | ICD-10-CM | POA: Diagnosis not present

## 2022-08-27 DIAGNOSIS — R059 Cough, unspecified: Secondary | ICD-10-CM | POA: Diagnosis not present

## 2022-08-27 DIAGNOSIS — U071 COVID-19: Secondary | ICD-10-CM | POA: Diagnosis not present

## 2022-09-12 DIAGNOSIS — L309 Dermatitis, unspecified: Secondary | ICD-10-CM | POA: Diagnosis not present

## 2022-09-12 DIAGNOSIS — L718 Other rosacea: Secondary | ICD-10-CM | POA: Diagnosis not present

## 2022-09-12 DIAGNOSIS — D225 Melanocytic nevi of trunk: Secondary | ICD-10-CM | POA: Diagnosis not present

## 2022-09-12 DIAGNOSIS — L821 Other seborrheic keratosis: Secondary | ICD-10-CM | POA: Diagnosis not present

## 2022-09-12 DIAGNOSIS — Z08 Encounter for follow-up examination after completed treatment for malignant neoplasm: Secondary | ICD-10-CM | POA: Diagnosis not present

## 2022-09-12 DIAGNOSIS — D492 Neoplasm of unspecified behavior of bone, soft tissue, and skin: Secondary | ICD-10-CM | POA: Diagnosis not present

## 2022-09-12 DIAGNOSIS — Z85828 Personal history of other malignant neoplasm of skin: Secondary | ICD-10-CM | POA: Diagnosis not present

## 2022-09-12 DIAGNOSIS — L814 Other melanin hyperpigmentation: Secondary | ICD-10-CM | POA: Diagnosis not present

## 2022-10-29 DIAGNOSIS — Z23 Encounter for immunization: Secondary | ICD-10-CM | POA: Diagnosis not present

## 2022-11-19 DIAGNOSIS — Z23 Encounter for immunization: Secondary | ICD-10-CM | POA: Diagnosis not present

## 2022-12-13 DIAGNOSIS — Z860101 Personal history of adenomatous and serrated colon polyps: Secondary | ICD-10-CM | POA: Diagnosis not present

## 2022-12-13 DIAGNOSIS — K5900 Constipation, unspecified: Secondary | ICD-10-CM | POA: Diagnosis not present

## 2022-12-26 ENCOUNTER — Ambulatory Visit
Admission: RE | Admit: 2022-12-26 | Discharge: 2022-12-26 | Disposition: A | Discharge status: Home / Self Care | Payer: Medicare Other | Source: Ambulatory Visit | Attending: Family Medicine | Admitting: Family Medicine

## 2022-12-26 DIAGNOSIS — Z1231 Encounter for screening mammogram for malignant neoplasm of breast: Secondary | ICD-10-CM | POA: Diagnosis not present

## 2022-12-27 DIAGNOSIS — Z01419 Encounter for gynecological examination (general) (routine) without abnormal findings: Secondary | ICD-10-CM | POA: Diagnosis not present

## 2023-01-27 ENCOUNTER — Other Ambulatory Visit: Payer: Self-pay | Admitting: Family Medicine

## 2023-01-27 DIAGNOSIS — Z0000 Encounter for general adult medical examination without abnormal findings: Secondary | ICD-10-CM | POA: Diagnosis not present

## 2023-01-27 DIAGNOSIS — M8580 Other specified disorders of bone density and structure, unspecified site: Secondary | ICD-10-CM | POA: Diagnosis not present

## 2023-01-27 DIAGNOSIS — E782 Mixed hyperlipidemia: Secondary | ICD-10-CM | POA: Diagnosis not present

## 2023-01-27 DIAGNOSIS — Z131 Encounter for screening for diabetes mellitus: Secondary | ICD-10-CM | POA: Diagnosis not present

## 2023-01-27 DIAGNOSIS — E559 Vitamin D deficiency, unspecified: Secondary | ICD-10-CM | POA: Diagnosis not present

## 2023-04-03 DIAGNOSIS — H04123 Dry eye syndrome of bilateral lacrimal glands: Secondary | ICD-10-CM | POA: Diagnosis not present

## 2023-04-03 DIAGNOSIS — H25813 Combined forms of age-related cataract, bilateral: Secondary | ICD-10-CM | POA: Diagnosis not present

## 2023-04-03 DIAGNOSIS — H524 Presbyopia: Secondary | ICD-10-CM | POA: Diagnosis not present

## 2023-04-03 DIAGNOSIS — H40013 Open angle with borderline findings, low risk, bilateral: Secondary | ICD-10-CM | POA: Diagnosis not present

## 2023-04-20 DIAGNOSIS — D122 Benign neoplasm of ascending colon: Secondary | ICD-10-CM | POA: Diagnosis not present

## 2023-04-20 DIAGNOSIS — K5730 Diverticulosis of large intestine without perforation or abscess without bleeding: Secondary | ICD-10-CM | POA: Diagnosis not present

## 2023-04-20 DIAGNOSIS — D120 Benign neoplasm of cecum: Secondary | ICD-10-CM | POA: Diagnosis not present

## 2023-04-20 DIAGNOSIS — D126 Benign neoplasm of colon, unspecified: Secondary | ICD-10-CM | POA: Diagnosis not present

## 2023-04-20 DIAGNOSIS — Z980 Intestinal bypass and anastomosis status: Secondary | ICD-10-CM | POA: Diagnosis not present

## 2023-04-20 DIAGNOSIS — K648 Other hemorrhoids: Secondary | ICD-10-CM | POA: Diagnosis not present

## 2023-04-20 DIAGNOSIS — Z860101 Personal history of adenomatous and serrated colon polyps: Secondary | ICD-10-CM | POA: Diagnosis not present

## 2023-04-20 DIAGNOSIS — Z09 Encounter for follow-up examination after completed treatment for conditions other than malignant neoplasm: Secondary | ICD-10-CM | POA: Diagnosis not present

## 2023-05-24 DIAGNOSIS — D126 Benign neoplasm of colon, unspecified: Secondary | ICD-10-CM | POA: Diagnosis not present

## 2023-05-24 DIAGNOSIS — D122 Benign neoplasm of ascending colon: Secondary | ICD-10-CM | POA: Diagnosis not present

## 2023-05-24 DIAGNOSIS — E785 Hyperlipidemia, unspecified: Secondary | ICD-10-CM | POA: Diagnosis not present

## 2023-05-24 DIAGNOSIS — Z882 Allergy status to sulfonamides status: Secondary | ICD-10-CM | POA: Diagnosis not present

## 2023-05-24 DIAGNOSIS — D120 Benign neoplasm of cecum: Secondary | ICD-10-CM | POA: Diagnosis not present

## 2023-05-24 DIAGNOSIS — K635 Polyp of colon: Secondary | ICD-10-CM | POA: Diagnosis not present

## 2023-06-02 DIAGNOSIS — H6120 Impacted cerumen, unspecified ear: Secondary | ICD-10-CM | POA: Diagnosis not present

## 2023-07-24 ENCOUNTER — Other Ambulatory Visit: Payer: Self-pay | Admitting: Family Medicine

## 2023-07-24 DIAGNOSIS — Z1231 Encounter for screening mammogram for malignant neoplasm of breast: Secondary | ICD-10-CM

## 2023-09-19 ENCOUNTER — Ambulatory Visit (HOSPITAL_BASED_OUTPATIENT_CLINIC_OR_DEPARTMENT_OTHER)
Admission: RE | Admit: 2023-09-19 | Discharge: 2023-09-19 | Disposition: A | Discharge status: Home / Self Care | Source: Ambulatory Visit | Attending: Family Medicine | Admitting: Family Medicine

## 2023-09-19 DIAGNOSIS — M8580 Other specified disorders of bone density and structure, unspecified site: Secondary | ICD-10-CM | POA: Insufficient documentation

## 2023-09-19 DIAGNOSIS — Z780 Asymptomatic menopausal state: Secondary | ICD-10-CM | POA: Diagnosis not present

## 2023-09-19 DIAGNOSIS — Z13820 Encounter for screening for osteoporosis: Secondary | ICD-10-CM | POA: Insufficient documentation

## 2023-09-19 DIAGNOSIS — M810 Age-related osteoporosis without current pathological fracture: Secondary | ICD-10-CM | POA: Insufficient documentation

## 2023-09-21 ENCOUNTER — Other Ambulatory Visit: Payer: Medicare Other

## 2023-09-22 DIAGNOSIS — Z08 Encounter for follow-up examination after completed treatment for malignant neoplasm: Secondary | ICD-10-CM | POA: Diagnosis not present

## 2023-09-22 DIAGNOSIS — D492 Neoplasm of unspecified behavior of bone, soft tissue, and skin: Secondary | ICD-10-CM | POA: Diagnosis not present

## 2023-09-22 DIAGNOSIS — Z85828 Personal history of other malignant neoplasm of skin: Secondary | ICD-10-CM | POA: Diagnosis not present

## 2023-09-22 DIAGNOSIS — L821 Other seborrheic keratosis: Secondary | ICD-10-CM | POA: Diagnosis not present

## 2023-09-22 DIAGNOSIS — I8393 Asymptomatic varicose veins of bilateral lower extremities: Secondary | ICD-10-CM | POA: Diagnosis not present

## 2023-09-22 DIAGNOSIS — D225 Melanocytic nevi of trunk: Secondary | ICD-10-CM | POA: Diagnosis not present

## 2023-09-22 DIAGNOSIS — L570 Actinic keratosis: Secondary | ICD-10-CM | POA: Diagnosis not present

## 2023-09-22 DIAGNOSIS — L300 Nummular dermatitis: Secondary | ICD-10-CM | POA: Diagnosis not present

## 2023-09-22 DIAGNOSIS — L814 Other melanin hyperpigmentation: Secondary | ICD-10-CM | POA: Diagnosis not present

## 2023-10-17 DIAGNOSIS — M810 Age-related osteoporosis without current pathological fracture: Secondary | ICD-10-CM | POA: Diagnosis not present

## 2023-11-02 DIAGNOSIS — Z23 Encounter for immunization: Secondary | ICD-10-CM | POA: Diagnosis not present

## 2023-11-25 DIAGNOSIS — Z23 Encounter for immunization: Secondary | ICD-10-CM | POA: Diagnosis not present

## 2023-12-27 ENCOUNTER — Ambulatory Visit
Admission: RE | Admit: 2023-12-27 | Discharge: 2023-12-27 | Disposition: A | Discharge status: Home / Self Care | Source: Ambulatory Visit | Attending: Family Medicine | Admitting: Family Medicine

## 2023-12-27 DIAGNOSIS — Z1231 Encounter for screening mammogram for malignant neoplasm of breast: Secondary | ICD-10-CM
